# Patient Record
Sex: Male | Born: 1955 | Race: White | Hispanic: No | Marital: Single | Smoking: Former smoker
Health system: Southern US, Community
[De-identification: ages and names within clinical notes are randomized; demographics above are authoritative.]

## PROBLEM LIST (undated history)

## (undated) DIAGNOSIS — F17200 Nicotine dependence, unspecified, uncomplicated: Secondary | ICD-10-CM

## (undated) DIAGNOSIS — J189 Pneumonia, unspecified organism: Secondary | ICD-10-CM

## (undated) DIAGNOSIS — I10 Essential (primary) hypertension: Secondary | ICD-10-CM

## (undated) DIAGNOSIS — B059 Measles without complication: Secondary | ICD-10-CM

## (undated) DIAGNOSIS — G609 Hereditary and idiopathic neuropathy, unspecified: Secondary | ICD-10-CM

## (undated) DIAGNOSIS — J449 Chronic obstructive pulmonary disease, unspecified: Secondary | ICD-10-CM

## (undated) DIAGNOSIS — F200 Paranoid schizophrenia: Secondary | ICD-10-CM

## (undated) DIAGNOSIS — B019 Varicella without complication: Secondary | ICD-10-CM

## (undated) DIAGNOSIS — J939 Pneumothorax, unspecified: Secondary | ICD-10-CM

## (undated) DIAGNOSIS — B269 Mumps without complication: Secondary | ICD-10-CM

## (undated) DIAGNOSIS — C4359 Malignant melanoma of other part of trunk: Secondary | ICD-10-CM

## (undated) HISTORY — DX: Measles without complication: B05.9

## (undated) HISTORY — DX: Malignant melanoma of other part of trunk: C43.59

## (undated) HISTORY — DX: Nicotine dependence, unspecified, uncomplicated: F17.200

## (undated) HISTORY — DX: Hereditary and idiopathic neuropathy, unspecified: G60.9

## (undated) HISTORY — DX: Varicella without complication: B01.9

## (undated) HISTORY — DX: Mumps without complication: B26.9

## (undated) HISTORY — PX: MOHS SURGERY: SUR867

## (undated) HISTORY — PX: WISDOM TOOTH EXTRACTION: SHX21

## (undated) HISTORY — DX: Essential (primary) hypertension: I10

## (undated) HISTORY — DX: Paranoid schizophrenia: F20.0

## (undated) HISTORY — PX: MULTIPLE TOOTH EXTRACTIONS: SHX2053

---

## 1983-08-06 DIAGNOSIS — F200 Paranoid schizophrenia: Secondary | ICD-10-CM

## 1983-08-06 HISTORY — DX: Paranoid schizophrenia: F20.0

## 1983-08-06 HISTORY — PX: CHOLECYSTECTOMY: SHX55

## 2002-06-30 ENCOUNTER — Emergency Department (HOSPITAL_COMMUNITY): Admission: EM | Admit: 2002-06-30 | Discharge: 2002-06-30 | Payer: Self-pay | Admitting: Emergency Medicine

## 2013-05-12 ENCOUNTER — Encounter (HOSPITAL_COMMUNITY): Payer: Self-pay | Admitting: Emergency Medicine

## 2013-05-12 ENCOUNTER — Emergency Department (INDEPENDENT_AMBULATORY_CARE_PROVIDER_SITE_OTHER)
Admission: EM | Admit: 2013-05-12 | Discharge: 2013-05-12 | Disposition: A | Discharge status: Home / Self Care | Payer: Medicare Other | Source: Home / Self Care | Attending: Emergency Medicine | Admitting: Emergency Medicine

## 2013-05-12 DIAGNOSIS — Z23 Encounter for immunization: Secondary | ICD-10-CM

## 2013-05-12 DIAGNOSIS — S61409A Unspecified open wound of unspecified hand, initial encounter: Secondary | ICD-10-CM

## 2013-05-12 DIAGNOSIS — S61432A Puncture wound without foreign body of left hand, initial encounter: Secondary | ICD-10-CM

## 2013-05-12 HISTORY — DX: Essential (primary) hypertension: I10

## 2013-05-12 MED ORDER — TETANUS-DIPHTH-ACELL PERTUSSIS 5-2.5-18.5 LF-MCG/0.5 IM SUSP
INTRAMUSCULAR | Status: AC
  Filled 2013-05-12: qty 0.5

## 2013-05-12 MED ORDER — TETANUS-DIPHTH-ACELL PERTUSSIS 5-2.5-18.5 LF-MCG/0.5 IM SUSP
0.5000 mL | Freq: Once | INTRAMUSCULAR | Status: AC
  Administered 2013-05-12: 0.5 mL via INTRAMUSCULAR

## 2013-05-12 NOTE — ED Notes (Signed)
Ran L hand over rusty barbed wire @ 1600.  Multiple superficial small cuts to palm of L hand.  Bleeding stopped. Pt. States he washed it with soap and water.  No tetanus shot since 2003.  Here with mother. She states he lives in [facility] assisted living.

## 2013-05-12 NOTE — ED Provider Notes (Signed)
Chief Complaint:   Chief Complaint  Patient presents with  . Laceration    History of Present Illness:   Jeffrey Ochoa is a 57 year old male who sustained some puncture wounds on his left hand this afternoon from a barb wire fence. The bleeding is controlled. The barb wire crusty and older. He denies any numbness or tingling. He is able to move all his digits well.  Last tetanus shot was 11 years ago.  Review of Systems:  Other than noted above, the patient denies any of the following symptoms: Systemic:  No fever or chills. Musculoskeletal:  No joint pain or decreased range of motion. Neuro:  No numbness, tingling, or weakness.  PMFSH:  Past medical history, family history, social history, meds, and allergies were reviewed.  He has schizophrenia and takes Prolixin, Cogentin, Tenormin, and Zoloft.  Physical Exam:   Vital signs:  BP 117/73  Pulse 83  Temp(Src) 98.8 F (37.1 C) (Oral)  Resp 24  SpO2 97% Ext:  He has 3 shallow puncture wounds on the palm of his left hand. Bleeding was controlled. He has full range of motion of all his digits and good sensation and capillary refill.  All joints had a full ROM without pain.  Pulses were full.  Good capillary refill in all digits.  No edema. Neurological:  Alert and oriented.  No muscle weakness.  Sensation was intact to light touch.   Course in Urgent Care Center:   Given a Tdap. Wounds were cleansed with Betadine and saline and antibiotic ointment was applied and followed by Telfa and a Coban wrap.  Assessment:  The encounter diagnosis was Puncture wound, hand, left, initial encounter.  Plan:   1.  Meds:  The following meds were prescribed:   Discharge Medication List as of 05/12/2013  6:33 PM      2.  Patient Education/Counseling:  The patient was given appropriate handouts, self care instructions, and instructed in symptomatic relief.  Instructed in wound care.  3.  Follow up:  The patient was told to follow up if no better in  3 to 4 days, if becoming worse in any way, and given some red flag symptoms such as any sign of infection which would prompt immediate return.  Follow up here if necessary.       Reuben Likes, MD 05/12/13 2216

## 2013-08-11 ENCOUNTER — Ambulatory Visit: Payer: Self-pay | Admitting: Family Medicine

## 2013-09-21 ENCOUNTER — Ambulatory Visit: Payer: Medicare Other | Admitting: Family Medicine

## 2013-10-07 ENCOUNTER — Ambulatory Visit: Payer: Medicare Other | Admitting: Family Medicine

## 2013-11-17 ENCOUNTER — Ambulatory Visit: Payer: Medicare Other | Admitting: Internal Medicine

## 2013-11-30 ENCOUNTER — Ambulatory Visit (INDEPENDENT_AMBULATORY_CARE_PROVIDER_SITE_OTHER): Payer: Medicare Other | Admitting: Family Medicine

## 2013-11-30 ENCOUNTER — Encounter: Payer: Self-pay | Admitting: Family Medicine

## 2013-11-30 ENCOUNTER — Telehealth: Payer: Self-pay | Admitting: Family Medicine

## 2013-11-30 VITALS — BP 112/68 | HR 66 | Temp 98.5°F | Ht 79.0 in | Wt 191.1 lb

## 2013-11-30 DIAGNOSIS — C4359 Malignant melanoma of other part of trunk: Secondary | ICD-10-CM | POA: Insufficient documentation

## 2013-11-30 DIAGNOSIS — F200 Paranoid schizophrenia: Secondary | ICD-10-CM

## 2013-11-30 DIAGNOSIS — R52 Pain, unspecified: Secondary | ICD-10-CM

## 2013-11-30 DIAGNOSIS — F17200 Nicotine dependence, unspecified, uncomplicated: Secondary | ICD-10-CM

## 2013-11-30 DIAGNOSIS — I10 Essential (primary) hypertension: Secondary | ICD-10-CM

## 2013-11-30 DIAGNOSIS — G609 Hereditary and idiopathic neuropathy, unspecified: Secondary | ICD-10-CM

## 2013-11-30 MED ORDER — ACETAMINOPHEN 500 MG PO TABS: 500.0000 mg | ORAL_TABLET | Freq: Four times a day (QID) | ORAL | Status: DC | PRN

## 2013-11-30 MED ORDER — CYCLOBENZAPRINE HCL 5 MG PO TABS: 5.0000 mg | ORAL_TABLET | Freq: Three times a day (TID) | ORAL | Status: DC | PRN

## 2013-11-30 NOTE — Patient Instructions (Signed)
Preventive Care for Adults, Male A healthy lifestyle and preventive care can promote health and wellness. Preventive health guidelines for men include the following key practices:  A routine yearly physical is a good way to check with your health care provider about your health and preventative screening. It is a chance to share any concerns and updates on your health and to receive a thorough exam.  Visit your dentist for a routine exam and preventative care every 6 months. Brush your teeth twice a day and floss once a day. Good oral hygiene prevents tooth decay and gum disease.  The frequency of eye exams is based on your age, health, family medical history, use of contact lenses, and other factors. Follow your health care provider's recommendations for frequency of eye exams.  Eat a healthy diet. Foods such as vegetables, fruits, whole grains, low-fat dairy products, and lean protein foods contain the nutrients you need without too many calories. Decrease your intake of foods high in solid fats, added sugars, and salt. Eat the right amount of calories for you.Get information about a proper diet from your health care provider, if necessary.  Regular physical exercise is one of the most important things you can do for your health. Most adults should get at least 150 minutes of moderate-intensity exercise (any activity that increases your heart rate and causes you to sweat) each week. In addition, most adults need muscle-strengthening exercises on 2 or more days a week.  Maintain a healthy weight. The body mass index (BMI) is a screening tool to identify possible weight problems. It provides an estimate of body fat based on height and weight. Your health care provider can find your BMI and can help you achieve or maintain a healthy weight.For adults 20 years and older:  A BMI below 18.5 is considered underweight.  A BMI of 18.5 to 24.9 is normal.  A BMI of 25 to 29.9 is considered  overweight.  A BMI of 30 and above is considered obese.  Maintain normal blood lipids and cholesterol levels by exercising and minimizing your intake of saturated fat. Eat a balanced diet with plenty of fruit and vegetables. Blood tests for lipids and cholesterol should begin at age 42 and be repeated every 5 years. If your lipid or cholesterol levels are high, you are over 50, or you are at high risk for heart disease, you may need your cholesterol levels checked more frequently.Ongoing high lipid and cholesterol levels should be treated with medicines if diet and exercise are not working.  If you smoke, find out from your health care provider how to quit. If you do not use tobacco, do not start.  Lung cancer screening is recommended for adults aged 24 80 years who are at high risk for developing lung cancer because of a history of smoking. A yearly low-dose CT scan of the lungs is recommended for people who have at least a 30-pack-year history of smoking and are a current smoker or have quit within the past 15 years. A pack year of smoking is smoking an average of 1 pack of cigarettes a day for 1 year (for example: 1 pack a day for 30 years or 2 packs a day for 15 years). Yearly screening should continue until the smoker has stopped smoking for at least 15 years. Yearly screening should be stopped for people who develop a health problem that would prevent them from having lung cancer treatment.  If you choose to drink alcohol, do not have  more than 2 drinks per day. One drink is considered to be 12 ounces (355 mL) of beer, 5 ounces (148 mL) of wine, or 1.5 ounces (44 mL) of liquor.  Avoid use of street drugs. Do not share needles with anyone. Ask for help if you need support or instructions about stopping the use of drugs.  High blood pressure causes heart disease and increases the risk of stroke. Your blood pressure should be checked at least every 1 2 years. Ongoing high blood pressure should be  treated with medicines, if weight loss and exercise are not effective.  If you are 75 59 years old, ask your health care provider if you should take aspirin to prevent heart disease.  Diabetes screening involves taking a blood sample to check your fasting blood sugar level. This should be done once every 3 years, after age 19, if you are within normal weight and without risk factors for diabetes. Testing should be considered at a younger age or be carried out more frequently if you are overweight and have at least 1 risk factor for diabetes.  Colorectal cancer can be detected and often prevented. Most routine colorectal cancer screening begins at the age of 47 and continues through age 80. However, your health care provider may recommend screening at an earlier age if you have risk factors for colon cancer. On a yearly basis, your health care provider may provide home test kits to check for hidden blood in the stool. Use of a small camera at the end of a tube to directly examine the colon (sigmoidoscopy or colonoscopy) can detect the earliest forms of colorectal cancer. Talk to your health care provider about this at age 66, when routine screening begins. Direct exam of the colon should be repeated every 5 10 years through age 19, unless early forms of precancerous polyps or small growths are found.  People who are at an increased risk for hepatitis B should be screened for this virus. You are considered at high risk for hepatitis B if:  You were born in a country where hepatitis B occurs often. Talk with your health care provider about which countries are considered high-risk.  Your parents were born in a high-risk country and you have not received a shot to protect against hepatitis B (hepatitis B vaccine).  You have HIV or AIDS.  You use needles to inject street drugs.  You live with, or have sex with, someone who has hepatitis B.  You are a man who has sex with other men (MSM).  You get  hemodialysis treatment.  You take certain medicines for conditions such as cancer, organ transplantation, and autoimmune conditions.  Hepatitis C blood testing is recommended for all people born from [birth year] through 1965 and any individual with known risks for hepatitis C.  Practice safe sex. Use condoms and avoid high-risk sexual practices to reduce the spread of sexually transmitted infections (STIs). STIs include gonorrhea, chlamydia, syphilis, trichomonas, herpes, HPV, and human immunodeficiency virus (HIV). Herpes, HIV, and HPV are viral illnesses that have no cure. They can result in disability, cancer, and death.  A one-time screening for abdominal aortic aneurysm (AAA) and surgical repair of large AAAs by ultrasound are recommended for men ages 94 to 74 years who are current or former smokers.  Healthy men should no longer receive prostate-specific antigen (PSA) blood tests as part of routine cancer screening. Talk with your health care provider about prostate cancer screening.  Testicular cancer screening is not recommended  for adult males who have no symptoms. Screening includes self-exam, a health care provider exam, and other screening tests. Consult with your health care provider about any symptoms you have or any concerns you have about testicular cancer.  Use sunscreen. Apply sunscreen liberally and repeatedly throughout the day. You should seek shade when your shadow is shorter than you. Protect yourself by wearing long sleeves, pants, a wide-brimmed hat, and sunglasses year round, whenever you are outdoors.  Once a month, do a whole-body skin exam, using a mirror to look at the skin on your back. Tell your health care provider about new moles, moles that have irregular borders, moles that are larger than a pencil eraser, or moles that have changed in shape or color.  Stay current with required vaccines (immunizations).  Influenza vaccine. All adults should be immunized every  year.  Tetanus, diphtheria, and acellular pertussis (Td, Tdap) vaccine. An adult who has not previously received Tdap or who does not know his vaccine status should receive 1 dose of Tdap. This initial dose should be followed by tetanus and diphtheria toxoids (Td) booster doses every 10 years. Adults with an unknown or incomplete history of completing a 3-dose immunization series with Td-containing vaccines should begin or complete a primary immunization series including a Tdap dose. Adults should receive a Td booster every 10 years.  Varicella vaccine. An adult without evidence of immunity to varicella should receive 2 doses or a second dose if he has previously received 1 dose.  Human papillomavirus (HPV) vaccine. Males aged 44 21 years who have not received the vaccine previously should receive the 3-dose series. Males aged 43 26 years may be immunized. Immunization is recommended through the age of 50 years for any male who has sex with males and did not get any or all doses earlier. Immunization is recommended for any person with an immunocompromised condition through the age of 23 years if he did not get any or all doses earlier. During the 3-dose series, the second dose should be obtained 4 8 weeks after the first dose. The third dose should be obtained 24 weeks after the first dose and 16 weeks after the second dose.  Zoster vaccine. One dose is recommended for adults aged 96 years or older unless certain conditions are present.  Measles, mumps, and rubella (MMR) vaccine. Adults born before 55 generally are considered immune to measles and mumps. Adults born in [birth year] or later should have 1 or more doses of MMR vaccine unless there is a contraindication to the vaccine or there is laboratory evidence of immunity to each of the three diseases. A routine second dose of MMR vaccine should be obtained at least 28 days after the first dose for students attending postsecondary schools, health care  workers, or international travelers. People who received inactivated measles vaccine or an unknown type of measles vaccine during 1963 1967 should receive 2 doses of MMR vaccine. People who received inactivated mumps vaccine or an unknown type of mumps vaccine before 1979 and are at high risk for mumps infection should consider immunization with 2 doses of MMR vaccine. Unvaccinated health care workers born before [birth year] who lack laboratory evidence of measles, mumps, or rubella immunity or laboratory confirmation of disease should consider measles and mumps immunization with 2 doses of MMR vaccine or rubella immunization with 1 dose of MMR vaccine.  Pneumococcal 13-valent conjugate (PCV13) vaccine. When indicated, a person who is uncertain of his immunization history and has no record of immunization  should receive the PCV13 vaccine. An adult aged 67 years or older who has certain medical conditions and has not been previously immunized should receive 1 dose of PCV13 vaccine. This PCV13 should be followed with a dose of pneumococcal polysaccharide (PPSV23) vaccine. The PPSV23 vaccine dose should be obtained at least 8 weeks after the dose of PCV13 vaccine. An adult aged 79 years or older who has certain medical conditions and previously received 1 or more doses of PPSV23 vaccine should receive 1 dose of PCV13. The PCV13 vaccine dose should be obtained 1 or more years after the last PPSV23 vaccine dose.  Pneumococcal polysaccharide (PPSV23) vaccine. When PCV13 is also indicated, PCV13 should be obtained first. All adults aged 74 years and older should be immunized. An adult younger than age 50 years who has certain medical conditions should be immunized. Any person who resides in a nursing home or long-term care facility should be immunized. An adult smoker should be immunized. People with an immunocompromised condition and certain other conditions should receive both PCV13 and PPSV23 vaccines. People with human  immunodeficiency virus (HIV) infection should be immunized as soon as possible after diagnosis. Immunization during chemotherapy or radiation therapy should be avoided. Routine use of PPSV23 vaccine is not recommended for American Indians, [place] Natives, or people younger than 65 years unless there are medical conditions that require PPSV23 vaccine. When indicated, people who have unknown immunization and have no record of immunization should receive PPSV23 vaccine. One-time revaccination 5 years after the first dose of PPSV23 is recommended for people aged 41 64 years who have chronic kidney failure, nephrotic syndrome, asplenia, or immunocompromised conditions. People who received 1 2 doses of PPSV23 before age 15 years should receive another dose of PPSV23 vaccine at age 48 years or later if at least 5 years have passed since the previous dose. Doses of PPSV23 are not needed for people immunized with PPSV23 at or after age 69 years.  Meningococcal vaccine. Adults with asplenia or persistent complement component deficiencies should receive 2 doses of quadrivalent meningococcal conjugate (MenACWY-D) vaccine. The doses should be obtained at least 2 months apart. Microbiologists working with certain meningococcal bacteria, [place] recruits, people at risk during an outbreak, and people who travel to or live in countries with a high rate of meningitis should be immunized. A first-year college student up through age 7 years who is living in a residence hall should receive a dose if he did not receive a dose on or after his 16th birthday. Adults who have certain high-risk conditions should receive one or more doses of vaccine.  Hepatitis A vaccine. Adults who wish to be protected from this disease, have certain high-risk conditions, work with hepatitis A-infected animals, work in hepatitis A research labs, or travel to or work in countries with a high rate of hepatitis A should be immunized. Adults who were  previously unvaccinated and who anticipate close contact with an international adoptee during the first 60 days after arrival in the [place] from a country with a high rate of hepatitis A should be immunized.  Hepatitis B vaccine. Adults who wish to be protected from this disease, have certain high-risk conditions, may be exposed to blood or other infectious body fluids, are household contacts or sex partners of hepatitis B positive people, are clients or workers in certain care facilities, or travel to or work in countries with a high rate of hepatitis B should be immunized.  Haemophilus influenzae type b (Hib) vaccine. A  previously unvaccinated person with asplenia or sickle cell disease or having a scheduled splenectomy should receive 1 dose of Hib vaccine. Regardless of previous immunization, a recipient of a hematopoietic stem cell transplant should receive a 3-dose series 6 12 months after his successful transplant. Hib vaccine is not recommended for adults with HIV infection. Preventive Service / Frequency Ages 62 to 3  Blood pressure check.** / Every 1 to 2 years.  Lipid and cholesterol check.** / Every 5 years beginning at age 43.  Hepatitis C blood test.** / For any individual with known risks for hepatitis C.  Skin self-exam. / Monthly.  Influenza vaccine. / Every year.  Tetanus, diphtheria, and acellular pertussis (Tdap, Td) vaccine.** / Consult your health care provider. 1 dose of Td every 10 years.  Varicella vaccine.** / Consult your health care provider.  HPV vaccine. / 3 doses over 6 months, if 48 or younger.  Measles, mumps, rubella (MMR) vaccine.** / You need at least 1 dose of MMR if you were born in 1957 or later. You may also need a second dose.  Pneumococcal 13-valent conjugate (PCV13) vaccine.** / Consult your health care provider.  Pneumococcal polysaccharide (PPSV23) vaccine.** / 1 to 2 doses if you smoke cigarettes or if you have certain  conditions.  Meningococcal vaccine.** / 1 dose if you are age 8 to 70 years and a Market researcher living in a residence hall, or have one of several medical conditions. You may also need additional booster doses.  Hepatitis A vaccine.** / Consult your health care provider.  Hepatitis B vaccine.** / Consult your health care provider.  Haemophilus influenzae type b (Hib) vaccine.** / Consult your health care provider. Ages 48 to 32  Blood pressure check.** / Every 1 to 2 years.  Lipid and cholesterol check.** / Every 5 years beginning at age 38.  Lung cancer screening. / Every year if you are aged 40 80 years and have a 30-pack-year history of smoking and currently smoke or have quit within the past 15 years. Yearly screening is stopped once you have quit smoking for at least 15 years or develop a health problem that would prevent you from having lung cancer treatment.  Fecal occult blood test (FOBT) of stool. / Every year beginning at age 4 and continuing until age 70. You may not have to do this test if you get a colonoscopy every 10 years.  Flexible sigmoidoscopy** or colonoscopy.** / Every 5 years for a flexible sigmoidoscopy or every 10 years for a colonoscopy beginning at age 76 and continuing until age 62.  Hepatitis C blood test.** / For all people born from [birth year] through 1965 and any individual with known risks for hepatitis C.  Skin self-exam. / Monthly.  Influenza vaccine. / Every year.  Tetanus, diphtheria, and acellular pertussis (Tdap/Td) vaccine.** / Consult your health care provider. 1 dose of Td every 10 years.  Varicella vaccine.** / Consult your health care provider.  Zoster vaccine.** / 1 dose for adults aged 60 years or older.  Measles, mumps, rubella (MMR) vaccine.** / You need at least 1 dose of MMR if you were born in 1957 or later. You may also need a second dose.  Pneumococcal 13-valent conjugate (PCV13) vaccine.** / Consult your health care  provider.  Pneumococcal polysaccharide (PPSV23) vaccine.** / 1 to 2 doses if you smoke cigarettes or if you have certain conditions.  Meningococcal vaccine.** / Consult your health care provider.  Hepatitis A vaccine.** / Consult your health care  provider.  Hepatitis B vaccine.** / Consult your health care provider.  Haemophilus influenzae type b (Hib) vaccine.** / Consult your health care provider. Ages 65 and over  Blood pressure check.** / Every 1 to 2 years.  Lipid and cholesterol check.**/ Every 5 years beginning at age 20.  Lung cancer screening. / Every year if you are aged 55 80 years and have a 30-pack-year history of smoking and currently smoke or have quit within the past 15 years. Yearly screening is stopped once you have quit smoking for at least 15 years or develop a health problem that would prevent you from having lung cancer treatment.  Fecal occult blood test (FOBT) of stool. / Every year beginning at age 50 and continuing until age 75. You may not have to do this test if you get a colonoscopy every 10 years.  Flexible sigmoidoscopy** or colonoscopy.** / Every 5 years for a flexible sigmoidoscopy or every 10 years for a colonoscopy beginning at age 50 and continuing until age 75.  Hepatitis C blood test.** / For all people born from 1945 through 1965 and any individual with known risks for hepatitis C.  Abdominal aortic aneurysm (AAA) screening.** / A one-time screening for ages 65 to 75 years who are current or former smokers.  Skin self-exam. / Monthly.  Influenza vaccine. / Every year.  Tetanus, diphtheria, and acellular pertussis (Tdap/Td) vaccine.** / 1 dose of Td every 10 years.  Varicella vaccine.** / Consult your health care provider.  Zoster vaccine.** / 1 dose for adults aged 60 years or older.  Pneumococcal 13-valent conjugate (PCV13) vaccine.** / Consult your health care provider.  Pneumococcal polysaccharide (PPSV23) vaccine.** / 1 dose for all  adults aged 65 years and older.  Meningococcal vaccine.** / Consult your health care provider.  Hepatitis A vaccine.** / Consult your health care provider.  Hepatitis B vaccine.** / Consult your health care provider.  Haemophilus influenzae type b (Hib) vaccine.** / Consult your health care provider. **Family history and personal history of risk and conditions may change your health care provider's recommendations. Document Released: 09/17/2001 Document Revised: 05/12/2013 Document Reviewed: 12/17/2010 [employer] Patient Information 2014 [employer].  

## 2013-11-30 NOTE — Progress Notes (Signed)
Pre visit review using our clinic review tool, if applicable. No additional management support is needed unless otherwise documented below in the visit note. 

## 2013-11-30 NOTE — Telephone Encounter (Signed)
Relevant patient education mailed to patient.  

## 2013-12-05 ENCOUNTER — Encounter: Payer: Self-pay | Admitting: Family Medicine

## 2013-12-05 DIAGNOSIS — I10 Essential (primary) hypertension: Secondary | ICD-10-CM

## 2013-12-05 DIAGNOSIS — G609 Hereditary and idiopathic neuropathy, unspecified: Secondary | ICD-10-CM

## 2013-12-05 DIAGNOSIS — F17200 Nicotine dependence, unspecified, uncomplicated: Secondary | ICD-10-CM | POA: Insufficient documentation

## 2013-12-05 HISTORY — DX: Nicotine dependence, unspecified, uncomplicated: F17.200

## 2013-12-05 HISTORY — DX: Hereditary and idiopathic neuropathy, unspecified: G60.9

## 2013-12-05 HISTORY — DX: Essential (primary) hypertension: I10

## 2013-12-05 NOTE — Assessment & Plan Note (Signed)
Following with psychiatry.

## 2013-12-05 NOTE — Assessment & Plan Note (Signed)
Has no interest in quitting, did encourage cessation. Encouraged complete cessation. Discussed need to quit as relates to risk of numerous cancers, cardiac and pulmonary disease as well as neurologic complications. Counseled for greater than 3 minutes

## 2013-12-05 NOTE — Assessment & Plan Note (Signed)
Stable, long standing

## 2013-12-05 NOTE — Progress Notes (Signed)
Patient ID: Jeffrey Ochoa, male   DOB: 1956/06/08, 58 y.o.   MRN: [id number] Jeffrey Ochoa [id number] 1955-10-30 12/05/2013      Progress Note New Patient  Subjective  Chief Complaint  Chief Complaint  Patient presents with  . Establish Care    new patient    HPI  Patient is a 58 year old male in today for routine medical care. Denies CP/palp/SOB/HA/congestion/fevers/GI or GU c/o. Taking meds as prescribed. He is a paranoid schizophrenic who has just relocated to the area to be near his sister and elderly mother. He is in a facility. No acute concerns. His schizophrenia is well controlled on current meds.  Past Medical History  Diagnosis Date  . Hypertension   . Chicken pox as a child  . Measles as a child  . Mumps as a child  . Schizophrenia, paranoid 1985  . Melanoma of back   . Tobacco use disorder 12/05/2013    2 ppd  . Unspecified hereditary and idiopathic peripheral neuropathy 12/05/2013  . HTN (hypertension) 12/05/2013    Past Surgical History  Procedure Laterality Date  . Cholecystectomy  1985  . Wisdom tooth extraction    . Multiple tooth extractions      has all teeth removed  . Mohs surgery      back    Family History  Problem Relation Age of Onset  . Hypertension Mother   . Prostate cancer Father   . Aortic dissection Father   . Fibromyalgia Sister   . Hypertension Sister   . Hyperlipidemia Sister     History   Social History  . Marital Status: Single    Spouse Name: N/A    Number of Children: N/A  . Years of Education: N/A   Occupational History  . Not on file.   Social History Main Topics  . Smoking status: Current Every Day Smoker -- 2.00 packs/day    Types: Cigarettes  . Smokeless tobacco: Former Systems developer  . Alcohol Use: No  . Drug Use: Yes    Special: Marijuana     Comment: none for 6 mos.  . Sexual Activity: No     Comment: lives in group home   Other Topics Concern  . Not on file   Social History Narrative  . No narrative on  file    Current Outpatient Prescriptions on File Prior to Visit  Medication Sig Dispense Refill  . atenolol (TENORMIN) 25 MG tablet Take 25 mg by mouth 2 (two) times daily.      . fluPHENAZine decanoate (PROLIXIN) 25 MG/ML injection Inject 75 mg into the muscle every 14 (fourteen) days.       No current facility-administered medications on file prior to visit.    Allergies  Allergen Reactions  . Risperidone And Related Other (See Comments)    Makes him Manic     Review of Systems  Review of Systems  Constitutional: Negative for fever, chills and malaise/fatigue.  HENT: Negative for congestion, hearing loss and nosebleeds.   Eyes: Negative for discharge.  Respiratory: Negative for cough, sputum production, shortness of breath and wheezing.   Cardiovascular: Negative for chest pain, palpitations and leg swelling.  Gastrointestinal: Negative for heartburn, nausea, vomiting, abdominal pain, diarrhea, constipation and blood in stool.  Genitourinary: Negative for dysuria, urgency, frequency and hematuria.  Musculoskeletal: Negative for back pain, falls and myalgias.  Skin: Negative for rash.  Neurological: Negative for dizziness, tremors, sensory change, focal weakness, loss of consciousness, weakness and headaches.  Endo/Heme/Allergies: Negative for polydipsia. Does not bruise/bleed easily.  Psychiatric/Behavioral: Negative for depression and suicidal ideas. The patient is nervous/anxious. The patient does not have insomnia.     Objective  BP 112/68  Pulse 66  Temp(Src) 98.5 F (36.9 C) (Oral)  Ht 6\' 7"  (2.007 m)  Wt 191 lb 1.9 oz (86.691 kg)  BMI 21.52 kg/m2  SpO2 96%  Physical Exam  Physical Exam  Constitutional: He is oriented to person, place, and time and well-developed, well-nourished, and in no distress. No distress.  HENT:  Head: Normocephalic and atraumatic.  Eyes: Conjunctivae are normal.  Neck: Neck supple. No thyromegaly present.  Cardiovascular: Normal  rate, regular rhythm and normal heart sounds.   No murmur heard. Pulmonary/Chest: Effort normal and breath sounds normal. No respiratory distress.  Abdominal: He exhibits no distension and no mass. There is no tenderness.  Musculoskeletal: He exhibits no edema.  Neurological: He is alert and oriented to person, place, and time.  Skin: Skin is warm.  Psychiatric: Memory, affect and judgment normal.       Assessment & Plan  Schizophrenia, paranoid Following with psychiatry.   Tobacco use disorder Has no interest in quitting, did encourage cessation. Encouraged complete cessation. Discussed need to quit as relates to risk of numerous cancers, cardiac and pulmonary disease as well as neurologic complications. Counseled for greater than 3 minutes  Unspecified hereditary and idiopathic peripheral neuropathy Stable, long standing  HTN (hypertension) Well controlled, no changes to meds. Encouraged heart healthy diet such as the DASH diet and exercise as tolerated. Continue Atenolol  Melanoma of back Follows with dermatology

## 2013-12-05 NOTE — Assessment & Plan Note (Signed)
Follows with dermatology 

## 2013-12-05 NOTE — Assessment & Plan Note (Signed)
Well controlled, no changes to meds. Encouraged heart healthy diet such as the DASH diet and exercise as tolerated. Continue Atenolol

## 2014-04-05 ENCOUNTER — Ambulatory Visit (HOSPITAL_BASED_OUTPATIENT_CLINIC_OR_DEPARTMENT_OTHER)
Admission: RE | Admit: 2014-04-05 | Discharge: 2014-04-05 | Disposition: A | Discharge status: Home / Self Care | Payer: Medicare Other | Source: Ambulatory Visit | Attending: Family Medicine | Admitting: Family Medicine

## 2014-04-05 ENCOUNTER — Ambulatory Visit (INDEPENDENT_AMBULATORY_CARE_PROVIDER_SITE_OTHER): Payer: Medicare Other | Admitting: Family Medicine

## 2014-04-05 ENCOUNTER — Encounter: Payer: Self-pay | Admitting: Family Medicine

## 2014-04-05 VITALS — BP 128/82 | HR 69 | Temp 98.4°F | Ht 79.0 in | Wt 189.8 lb

## 2014-04-05 DIAGNOSIS — Z79899 Other long term (current) drug therapy: Secondary | ICD-10-CM

## 2014-04-05 DIAGNOSIS — E785 Hyperlipidemia, unspecified: Secondary | ICD-10-CM

## 2014-04-05 DIAGNOSIS — F200 Paranoid schizophrenia: Secondary | ICD-10-CM

## 2014-04-05 DIAGNOSIS — R918 Other nonspecific abnormal finding of lung field: Secondary | ICD-10-CM

## 2014-04-05 DIAGNOSIS — Z0000 Encounter for general adult medical examination without abnormal findings: Secondary | ICD-10-CM

## 2014-04-05 DIAGNOSIS — R222 Localized swelling, mass and lump, trunk: Secondary | ICD-10-CM | POA: Insufficient documentation

## 2014-04-05 DIAGNOSIS — E559 Vitamin D deficiency, unspecified: Secondary | ICD-10-CM

## 2014-04-05 DIAGNOSIS — C4359 Malignant melanoma of other part of trunk: Secondary | ICD-10-CM

## 2014-04-05 DIAGNOSIS — I10 Essential (primary) hypertension: Secondary | ICD-10-CM

## 2014-04-05 DIAGNOSIS — F17200 Nicotine dependence, unspecified, uncomplicated: Secondary | ICD-10-CM

## 2014-04-05 NOTE — Patient Instructions (Signed)
Preventive Care for Adults A healthy lifestyle and preventive care can promote health and wellness. Preventive health guidelines for men include the following key practices:  A routine yearly physical is a good way to check with your health care provider about your health and preventative screening. It is a chance to share any concerns and updates on your health and to receive a thorough exam.  Visit your dentist for a routine exam and preventative care every 6 months. Brush your teeth twice a day and floss once a day. Good oral hygiene prevents tooth decay and gum disease.  The frequency of eye exams is based on your age, health, family medical history, use of contact lenses, and other factors. Follow your health care provider's recommendations for frequency of eye exams.  Eat a healthy diet. Foods such as vegetables, fruits, whole grains, low-fat dairy products, and lean protein foods contain the nutrients you need without too many calories. Decrease your intake of foods high in solid fats, added sugars, and salt. Eat the right amount of calories for you.Get information about a proper diet from your health care provider, if necessary.  Regular physical exercise is one of the most important things you can do for your health. Most adults should get at least 150 minutes of moderate-intensity exercise (any activity that increases your heart rate and causes you to sweat) each week. In addition, most adults need muscle-strengthening exercises on 2 or more days a week.  Maintain a healthy weight. The body mass index (BMI) is a screening tool to identify possible weight problems. It provides an estimate of body fat based on height and weight. Your health care provider can find your BMI and can help you achieve or maintain a healthy weight.For adults 20 years and older:  A BMI below 18.5 is considered underweight.  A BMI of 18.5 to 24.9 is normal.  A BMI of 25 to 29.9 is considered overweight.  A BMI  of 30 and above is considered obese.  Maintain normal blood lipids and cholesterol levels by exercising and minimizing your intake of saturated fat. Eat a balanced diet with plenty of fruit and vegetables. Blood tests for lipids and cholesterol should begin at age 50 and be repeated every 5 years. If your lipid or cholesterol levels are high, you are over 50, or you are at high risk for heart disease, you may need your cholesterol levels checked more frequently.Ongoing high lipid and cholesterol levels should be treated with medicines if diet and exercise are not working.  If you smoke, find out from your health care provider how to quit. If you do not use tobacco, do not start.  Lung cancer screening is recommended for adults aged 73-80 years who are at high risk for developing lung cancer because of a history of smoking. A yearly low-dose CT scan of the lungs is recommended for people who have at least a 30-pack-year history of smoking and are a current smoker or have quit within the past 15 years. A pack year of smoking is smoking an average of 1 pack of cigarettes a day for 1 year (for example: 1 pack a day for 30 years or 2 packs a day for 15 years). Yearly screening should continue until the smoker has stopped smoking for at least 15 years. Yearly screening should be stopped for people who develop a health problem that would prevent them from having lung cancer treatment.  If you choose to drink alcohol, do not have more than  2 drinks per day. One drink is considered to be 12 ounces (355 mL) of beer, 5 ounces (148 mL) of wine, or 1.5 ounces (44 mL) of liquor.  Avoid use of street drugs. Do not share needles with anyone. Ask for help if you need support or instructions about stopping the use of drugs.  High blood pressure causes heart disease and increases the risk of stroke. Your blood pressure should be checked at least every 1-2 years. Ongoing high blood pressure should be treated with  medicines, if weight loss and exercise are not effective.  If you are 45-79 years old, ask your health care provider if you should take aspirin to prevent heart disease.  Diabetes screening involves taking a blood sample to check your fasting blood sugar level. This should be done once every 3 years, after age 45, if you are within normal weight and without risk factors for diabetes. Testing should be considered at a younger age or be carried out more frequently if you are overweight and have at least 1 risk factor for diabetes.  Colorectal cancer can be detected and often prevented. Most routine colorectal cancer screening begins at the age of 50 and continues through age 75. However, your health care provider may recommend screening at an earlier age if you have risk factors for colon cancer. On a yearly basis, your health care provider may provide home test kits to check for hidden blood in the stool. Use of a small camera at the end of a tube to directly examine the colon (sigmoidoscopy or colonoscopy) can detect the earliest forms of colorectal cancer. Talk to your health care provider about this at age 50, when routine screening begins. Direct exam of the colon should be repeated every 5-10 years through age 75, unless early forms of precancerous polyps or small growths are found.  People who are at an increased risk for hepatitis B should be screened for this virus. You are considered at high risk for hepatitis B if:  You were born in a country where hepatitis B occurs often. Talk with your health care provider about which countries are considered high risk.  Your parents were born in a high-risk country and you have not received a shot to protect against hepatitis B (hepatitis B vaccine).  You have HIV or AIDS.  You use needles to inject street drugs.  You live with, or have sex with, someone who has hepatitis B.  You are a man who has sex with other men (MSM).  You get hemodialysis  treatment.  You take certain medicines for conditions such as cancer, organ transplantation, and autoimmune conditions.  Hepatitis C blood testing is recommended for all people born from 1945 through 1965 and any individual with known risks for hepatitis C.  Practice safe sex. Use condoms and avoid high-risk sexual practices to reduce the spread of sexually transmitted infections (STIs). STIs include gonorrhea, chlamydia, syphilis, trichomonas, herpes, HPV, and human immunodeficiency virus (HIV). Herpes, HIV, and HPV are viral illnesses that have no cure. They can result in disability, cancer, and death.  If you are at risk of being infected with HIV, it is recommended that you take a prescription medicine daily to prevent HIV infection. This is called preexposure prophylaxis (PrEP). You are considered at risk if:  You are a man who has sex with other men (MSM) and have other risk factors.  You are a heterosexual man, are sexually active, and are at increased risk for HIV infection.    You take drugs by injection.  You are sexually active with a partner who has HIV.  Talk with your health care provider about whether you are at high risk of being infected with HIV. If you choose to begin PrEP, you should first be tested for HIV. You should then be tested every 3 months for as long as you are taking PrEP.  A one-time screening for abdominal aortic aneurysm (AAA) and surgical repair of large AAAs by ultrasound are recommended for men ages 32 to 67 years who are current or former smokers.  Healthy men should no longer receive prostate-specific antigen (PSA) blood tests as part of routine cancer screening. Talk with your health care provider about prostate cancer screening.  Testicular cancer screening is not recommended for adult males who have no symptoms. Screening includes self-exam, a health care provider exam, and other screening tests. Consult with your health care provider about any symptoms  you have or any concerns you have about testicular cancer.  Use sunscreen. Apply sunscreen liberally and repeatedly throughout the day. You should seek shade when your shadow is shorter than you. Protect yourself by wearing long sleeves, pants, a wide-brimmed hat, and sunglasses year round, whenever you are outdoors.  Once a month, do a whole-body skin exam, using a mirror to look at the skin on your back. Tell your health care provider about new moles, moles that have irregular borders, moles that are larger than a pencil eraser, or moles that have changed in shape or color.  Stay current with required vaccines (immunizations).  Influenza vaccine. All adults should be immunized every year.  Tetanus, diphtheria, and acellular pertussis (Td, Tdap) vaccine. An adult who has not previously received Tdap or who does not know his vaccine status should receive 1 dose of Tdap. This initial dose should be followed by tetanus and diphtheria toxoids (Td) booster doses every 10 years. Adults with an unknown or incomplete history of completing a 3-dose immunization series with Td-containing vaccines should begin or complete a primary immunization series including a Tdap dose. Adults should receive a Td booster every 10 years.  Varicella vaccine. An adult without evidence of immunity to varicella should receive 2 doses or a second dose if he has previously received 1 dose.  Human papillomavirus (HPV) vaccine. Males aged 68-21 years who have not received the vaccine previously should receive the 3-dose series. Males aged 22-26 years may be immunized. Immunization is recommended through the age of 6 years for any male who has sex with males and did not get any or all doses earlier. Immunization is recommended for any person with an immunocompromised condition through the age of 49 years if he did not get any or all doses earlier. During the 3-dose series, the second dose should be obtained 4-8 weeks after the first  dose. The third dose should be obtained 24 weeks after the first dose and 16 weeks after the second dose.  Zoster vaccine. One dose is recommended for adults aged 50 years or older unless certain conditions are present.  Measles, mumps, and rubella (MMR) vaccine. Adults born before [birth year] generally are considered immune to measles and mumps. Adults born in [birth year] or later should have 1 or more doses of MMR vaccine unless there is a contraindication to the vaccine or there is laboratory evidence of immunity to each of the three diseases. A routine second dose of MMR vaccine should be obtained at least 28 days after the first dose for students attending postsecondary  schools, health care workers, or international travelers. People who received inactivated measles vaccine or an unknown type of measles vaccine during 1963-1967 should receive 2 doses of MMR vaccine. People who received inactivated mumps vaccine or an unknown type of mumps vaccine before 1979 and are at high risk for mumps infection should consider immunization with 2 doses of MMR vaccine. Unvaccinated health care workers born before 1957 who lack laboratory evidence of measles, mumps, or rubella immunity or laboratory confirmation of disease should consider measles and mumps immunization with 2 doses of MMR vaccine or rubella immunization with 1 dose of MMR vaccine.  Pneumococcal 13-valent conjugate (PCV13) vaccine. When indicated, a person who is uncertain of his immunization history and has no record of immunization should receive the PCV13 vaccine. An adult aged 19 years or older who has certain medical conditions and has not been previously immunized should receive 1 dose of PCV13 vaccine. This PCV13 should be followed with a dose of pneumococcal polysaccharide (PPSV23) vaccine. The PPSV23 vaccine dose should be obtained at least 8 weeks after the dose of PCV13 vaccine. An adult aged 19 years or older who has certain medical conditions and  previously received 1 or more doses of PPSV23 vaccine should receive 1 dose of PCV13. The PCV13 vaccine dose should be obtained 1 or more years after the last PPSV23 vaccine dose.  Pneumococcal polysaccharide (PPSV23) vaccine. When PCV13 is also indicated, PCV13 should be obtained first. All adults aged 65 years and older should be immunized. An adult younger than age 65 years who has certain medical conditions should be immunized. Any person who resides in a nursing home or long-term care facility should be immunized. An adult smoker should be immunized. People with an immunocompromised condition and certain other conditions should receive both PCV13 and PPSV23 vaccines. People with human immunodeficiency virus (HIV) infection should be immunized as soon as possible after diagnosis. Immunization during chemotherapy or radiation therapy should be avoided. Routine use of PPSV23 vaccine is not recommended for American Indians, [place] Natives, or people younger than 65 years unless there are medical conditions that require PPSV23 vaccine. When indicated, people who have unknown immunization and have no record of immunization should receive PPSV23 vaccine. One-time revaccination 5 years after the first dose of PPSV23 is recommended for people aged 19-64 years who have chronic kidney failure, nephrotic syndrome, asplenia, or immunocompromised conditions. People who received 1-2 doses of PPSV23 before age 65 years should receive another dose of PPSV23 vaccine at age 65 years or later if at least 5 years have passed since the previous dose. Doses of PPSV23 are not needed for people immunized with PPSV23 at or after age 65 years.  Meningococcal vaccine. Adults with asplenia or persistent complement component deficiencies should receive 2 doses of quadrivalent meningococcal conjugate (MenACWY-D) vaccine. The doses should be obtained at least 2 months apart. Microbiologists working with certain meningococcal bacteria,  military recruits, people at risk during an outbreak, and people who travel to or live in countries with a high rate of meningitis should be immunized. A first-year college student up through age 21 years who is living in a residence hall should receive a dose if he did not receive a dose on or after his 16th birthday. Adults who have certain high-risk conditions should receive one or more doses of vaccine.  Hepatitis A vaccine. Adults who wish to be protected from this disease, have certain high-risk conditions, work with hepatitis A-infected animals, work in hepatitis A research labs, or   travel to or work in countries with a high rate of hepatitis A should be immunized. Adults who were previously unvaccinated and who anticipate close contact with an international adoptee during the first 60 days after arrival in the [place] from a country with a high rate of hepatitis A should be immunized.  Hepatitis B vaccine. Adults should be immunized if they wish to be protected from this disease, have certain high-risk conditions, may be exposed to blood or other infectious body fluids, are household contacts or sex partners of hepatitis B positive people, are clients or workers in certain care facilities, or travel to or work in countries with a high rate of hepatitis B.  Haemophilus influenzae type b (Hib) vaccine. A previously unvaccinated person with asplenia or sickle cell disease or having a scheduled splenectomy should receive 1 dose of Hib vaccine. Regardless of previous immunization, a recipient of a hematopoietic stem cell transplant should receive a 3-dose series 6-12 months after his successful transplant. Hib vaccine is not recommended for adults with HIV infection. Preventive Service / Frequency Ages 52 to 17  Blood pressure check.** / Every 1 to 2 years.  Lipid and cholesterol check.** / Every 5 years beginning at age 69.  Hepatitis C blood test.** / For any individual with known risks for  hepatitis C.  Skin self-exam. / Monthly.  Influenza vaccine. / Every year.  Tetanus, diphtheria, and acellular pertussis (Tdap, Td) vaccine.** / Consult your health care provider. 1 dose of Td every 10 years.  Varicella vaccine.** / Consult your health care provider.  HPV vaccine. / 3 doses over 6 months, if 72 or younger.  Measles, mumps, rubella (MMR) vaccine.** / You need at least 1 dose of MMR if you were born in 1957 or later. You may also need a second dose.  Pneumococcal 13-valent conjugate (PCV13) vaccine.** / Consult your health care provider.  Pneumococcal polysaccharide (PPSV23) vaccine.** / 1 to 2 doses if you smoke cigarettes or if you have certain conditions.  Meningococcal vaccine.** / 1 dose if you are age 35 to 60 years and a Market researcher living in a residence hall, or have one of several medical conditions. You may also need additional booster doses.  Hepatitis A vaccine.** / Consult your health care provider.  Hepatitis B vaccine.** / Consult your health care provider.  Haemophilus influenzae type b (Hib) vaccine.** / Consult your health care provider. Ages 35 to 8  Blood pressure check.** / Every 1 to 2 years.  Lipid and cholesterol check.** / Every 5 years beginning at age 57.  Lung cancer screening. / Every year if you are aged 44-80 years and have a 30-pack-year history of smoking and currently smoke or have quit within the past 15 years. Yearly screening is stopped once you have quit smoking for at least 15 years or develop a health problem that would prevent you from having lung cancer treatment.  Fecal occult blood test (FOBT) of stool. / Every year beginning at age 55 and continuing until age 73. You may not have to do this test if you get a colonoscopy every 10 years.  Flexible sigmoidoscopy** or colonoscopy.** / Every 5 years for a flexible sigmoidoscopy or every 10 years for a colonoscopy beginning at age 28 and continuing until age  1.  Hepatitis C blood test.** / For all people born from [birth year] through 1965 and any individual with known risks for hepatitis C.  Skin self-exam. / Monthly.  Influenza vaccine. / Every  year.  Tetanus, diphtheria, and acellular pertussis (Tdap/Td) vaccine.** / Consult your health care provider. 1 dose of Td every 10 years.  Varicella vaccine.** / Consult your health care provider.  Zoster vaccine.** / 1 dose for adults aged 53 years or older.  Measles, mumps, rubella (MMR) vaccine.** / You need at least 1 dose of MMR if you were born in 1957 or later. You may also need a second dose.  Pneumococcal 13-valent conjugate (PCV13) vaccine.** / Consult your health care provider.  Pneumococcal polysaccharide (PPSV23) vaccine.** / 1 to 2 doses if you smoke cigarettes or if you have certain conditions.  Meningococcal vaccine.** / Consult your health care provider.  Hepatitis A vaccine.** / Consult your health care provider.  Hepatitis B vaccine.** / Consult your health care provider.  Haemophilus influenzae type b (Hib) vaccine.** / Consult your health care provider. Ages 77 and over  Blood pressure check.** / Every 1 to 2 years.  Lipid and cholesterol check.**/ Every 5 years beginning at age 85.  Lung cancer screening. / Every year if you are aged 55-80 years and have a 30-pack-year history of smoking and currently smoke or have quit within the past 15 years. Yearly screening is stopped once you have quit smoking for at least 15 years or develop a health problem that would prevent you from having lung cancer treatment.  Fecal occult blood test (FOBT) of stool. / Every year beginning at age 33 and continuing until age 11. You may not have to do this test if you get a colonoscopy every 10 years.  Flexible sigmoidoscopy** or colonoscopy.** / Every 5 years for a flexible sigmoidoscopy or every 10 years for a colonoscopy beginning at age 28 and continuing until age 73.  Hepatitis C blood  test.** / For all people born from [birth year] through 1965 and any individual with known risks for hepatitis C.  Abdominal aortic aneurysm (AAA) screening.** / A one-time screening for ages 50 to 27 years who are current or former smokers.  Skin self-exam. / Monthly.  Influenza vaccine. / Every year.  Tetanus, diphtheria, and acellular pertussis (Tdap/Td) vaccine.** / 1 dose of Td every 10 years.  Varicella vaccine.** / Consult your health care provider.  Zoster vaccine.** / 1 dose for adults aged 34 years or older.  Pneumococcal 13-valent conjugate (PCV13) vaccine.** / Consult your health care provider.  Pneumococcal polysaccharide (PPSV23) vaccine.** / 1 dose for all adults aged 63 years and older.  Meningococcal vaccine.** / Consult your health care provider.  Hepatitis A vaccine.** / Consult your health care provider.  Hepatitis B vaccine.** / Consult your health care provider.  Haemophilus influenzae type b (Hib) vaccine.** / Consult your health care provider. **Family history and personal history of risk and conditions may change your health care provider's recommendations. Document Released: 09/17/2001 Document Revised: 07/27/2013 Document Reviewed: 12/17/2010 [facility] Patient Information 2015 [place], [place]. This information is not intended to replace advice given to you by your health care provider. Make sure you discuss any questions you have with your health care provider.

## 2014-04-05 NOTE — Progress Notes (Signed)
Pre visit review using our clinic review tool, if applicable. No additional management support is needed unless otherwise documented below in the visit note. 

## 2014-04-06 ENCOUNTER — Telehealth: Payer: Self-pay

## 2014-04-06 LAB — CBC
HEMATOCRIT: 46.5 % (ref 39.0–52.0)
Hemoglobin: 15.4 g/dL (ref 13.0–17.0)
MCHC: 33.1 g/dL (ref 30.0–36.0)
MCV: 96.9 fl (ref 78.0–100.0)
Platelets: 217 10*3/uL (ref 150.0–400.0)
RBC: 4.8 Mil/uL (ref 4.22–5.81)
RDW: 13.6 % (ref 11.5–15.5)
WBC: 9.1 10*3/uL (ref 4.0–10.5)

## 2014-04-06 LAB — HEPATIC FUNCTION PANEL
ALK PHOS: 97 U/L (ref 39–117)
ALT: 15 U/L (ref 0–53)
AST: 16 U/L (ref 0–37)
Albumin: 4.3 g/dL (ref 3.5–5.2)
BILIRUBIN TOTAL: 0.8 mg/dL (ref 0.2–1.2)
Bilirubin, Direct: 0.1 mg/dL (ref 0.0–0.3)
Total Protein: 7.1 g/dL (ref 6.0–8.3)

## 2014-04-06 LAB — RENAL FUNCTION PANEL
Albumin: 4.3 g/dL (ref 3.5–5.2)
BUN: 8 mg/dL (ref 6–23)
CALCIUM: 9.1 mg/dL (ref 8.4–10.5)
CHLORIDE: 106 meq/L (ref 96–112)
CO2: 30 meq/L (ref 19–32)
Creatinine, Ser: 0.8 mg/dL (ref 0.4–1.5)
GFR: 103.59 mL/min (ref >60.00)
Glucose, Bld: 90 mg/dL (ref 70–99)
POTASSIUM: 4.1 meq/L (ref 3.5–5.1)
Phosphorus: 3.9 mg/dL (ref 2.3–4.6)
SODIUM: 143 meq/L (ref 135–145)

## 2014-04-06 LAB — VITAMIN D 25 HYDROXY (VIT D DEFICIENCY, FRACTURES): VITD: 22.32 ng/mL — ABNORMAL LOW (ref 30.00–100.00)

## 2014-04-06 LAB — LIPID PANEL
Cholesterol: 144 mg/dL (ref 0–200)
HDL: 40.7 mg/dL (ref >39.00)
LDL Cholesterol: 86 mg/dL (ref 0–99)
NonHDL: 103.3
Total CHOL/HDL Ratio: 4
Triglycerides: 85 mg/dL (ref 0.0–149.0)
VLDL: 17 mg/dL (ref 0.0–40.0)

## 2014-04-06 LAB — PSA: PSA: 0.78 ng/mL (ref 0.10–4.00)

## 2014-04-06 LAB — TSH: TSH: 1.18 u[IU]/mL (ref 0.35–4.50)

## 2014-04-06 NOTE — Telephone Encounter (Signed)
Message copied by Varney Daily on Wed Apr 06, 2014 10:16 AM ------      Message from: Irven Baltimore      Created: Wed Apr 06, 2014 10:03 AM       Patient sister states that she is returning your call. ------

## 2014-04-06 NOTE — Telephone Encounter (Signed)
Patients sister doesn't have any access to his medical records.?  Pts sister is going to come by today to pickup paperwork to have brother sign stating we can speak to her. She is on her mothers demographics. I looked in Media and can't find where anything was scanned. Pt states they filled out this at his first appt. I apologized to patients sister and she voiced understanding stating she knows it wasn't me its HIPPA.  I did however tell pts sister there was nothing to be worried about and we would give her the final results once we get the paperwork back.   Pt voiced understanding

## 2014-04-11 ENCOUNTER — Encounter: Payer: Self-pay | Admitting: Family Medicine

## 2014-04-11 DIAGNOSIS — E559 Vitamin D deficiency, unspecified: Secondary | ICD-10-CM | POA: Insufficient documentation

## 2014-04-11 DIAGNOSIS — E785 Hyperlipidemia, unspecified: Secondary | ICD-10-CM | POA: Insufficient documentation

## 2014-04-11 DIAGNOSIS — Z0000 Encounter for general adult medical examination without abnormal findings: Secondary | ICD-10-CM | POA: Insufficient documentation

## 2014-04-11 DIAGNOSIS — R918 Other nonspecific abnormal finding of lung field: Secondary | ICD-10-CM | POA: Insufficient documentation

## 2014-04-11 NOTE — Assessment & Plan Note (Signed)
Start Vitamin D supplements

## 2014-04-11 NOTE — Assessment & Plan Note (Signed)
Reviewed old records and xray from 2011 showed lung mass vs pneumonia, repeat CXR today shows clearing.

## 2014-04-11 NOTE — Progress Notes (Signed)
Patient ID: Jeffrey Ochoa, male   DOB: 1955/12/01, 58 y.o.   MRN: [id number] JAEVON PARAS [id number] 1955/12/20 04/11/2014      Progress Note-Follow Up  Subjective  Chief Complaint  Chief Complaint  Patient presents with  . Annual Exam    medicare wellness    HPI  Patient is a 58 year old male in today for routine medical care. Male with history of paranoid schizophrenia in today with his mother. Lives in a domiciliary but she tends to his medical care. He doing well. Declines most medical treatment. No acute illness or concerns. Continues to smoke 2 packs per day. Denies CP/palp/SOB/HA/congestion/fevers/GI or GU c/o. Taking meds as prescribed  Past Medical History  Diagnosis Date  . Hypertension   . Chicken pox as a child  . Measles as a child  . Mumps as a child  . Schizophrenia, paranoid 1985  . Melanoma of back   . Tobacco use disorder 12/05/2013    2 ppd  . Unspecified hereditary and idiopathic peripheral neuropathy 12/05/2013  . HTN (hypertension) 12/05/2013    Past Surgical History  Procedure Laterality Date  . Cholecystectomy  1985  . Wisdom tooth extraction    . Multiple tooth extractions      has all teeth removed  . Mohs surgery      back    Family History  Problem Relation Age of Onset  . Hypertension Mother   . Prostate cancer Father   . Aortic dissection Father   . Fibromyalgia Sister   . Hypertension Sister   . Hyperlipidemia Sister     History   Social History  . Marital Status: Single    Spouse Name: N/A    Number of Children: N/A  . Years of Education: N/A   Occupational History  . Not on file.   Social History Main Topics  . Smoking status: Current Every Day Smoker -- 2.00 packs/day    Types: Cigarettes  . Smokeless tobacco: Former Systems developer  . Alcohol Use: No  . Drug Use: Yes    Special: Marijuana     Comment: none for 6 mos.  . Sexual Activity: No     Comment: lives in group home   Other Topics Concern  . Not on file   Social  History Narrative  . No narrative on file    Current Outpatient Prescriptions on File Prior to Visit  Medication Sig Dispense Refill  . acetaminophen (TYLENOL) 500 MG tablet Take 1 tablet (500 mg total) by mouth every 6 (six) hours as needed for mild pain or moderate pain.  30 tablet  5  . atenolol (TENORMIN) 25 MG tablet Take 25 mg by mouth 2 (two) times daily.      . benztropine (COGENTIN) 2 MG tablet Take 2 mg by mouth daily.      . clonazePAM (KLONOPIN) 1 MG tablet Take 1 mg by mouth 2 (two) times daily.      . cyclobenzaprine (FLEXERIL) 5 MG tablet Take 1 tablet (5 mg total) by mouth 3 (three) times daily as needed for muscle spasms.  30 tablet  5  . fluPHENAZine decanoate (PROLIXIN) 25 MG/ML injection Inject 75 mg into the muscle every 14 (fourteen) days.      Marland Kitchen sertraline (ZOLOFT) 50 MG tablet Take 50 mg by mouth daily.       No current facility-administered medications on file prior to visit.    Allergies  Allergen Reactions  . Risperidone And Related Other (  See Comments)    Makes him Manic     Review of Systems  Review of Systems  Constitutional: Negative for fever, chills and malaise/fatigue.  HENT: Negative for congestion, hearing loss and nosebleeds.   Eyes: Negative for discharge.  Respiratory: Negative for cough, sputum production, shortness of breath and wheezing.   Cardiovascular: Negative for chest pain, palpitations and leg swelling.  Gastrointestinal: Negative for heartburn, nausea, vomiting, abdominal pain, diarrhea, constipation and blood in stool.  Genitourinary: Negative for dysuria, urgency, frequency and hematuria.  Musculoskeletal: Negative for back pain, falls and myalgias.  Skin: Negative for rash.  Neurological: Negative for dizziness, tremors, sensory change, focal weakness, loss of consciousness, weakness and headaches.  Endo/Heme/Allergies: Negative for polydipsia. Does not bruise/bleed easily.  Psychiatric/Behavioral: Negative for depression and  suicidal ideas. The patient is not nervous/anxious and does not have insomnia.     Objective  BP 128/82  Pulse 69  Temp(Src) 98.4 F (36.9 C) (Oral)  Ht 6\' 7"  (2.007 m)  Wt 189 lb 12.8 oz (86.093 kg)  BMI 21.37 kg/m2  SpO2 96%  Physical Exam  Physical Exam  Constitutional: He is oriented to person, place, and time and well-developed, well-nourished, and in no distress. No distress.  HENT:  Head: Normocephalic and atraumatic.  Eyes: Conjunctivae are normal.  Neck: Neck supple. No thyromegaly present.  Cardiovascular: Normal rate, regular rhythm and normal heart sounds.   No murmur heard. Pulmonary/Chest: Effort normal and breath sounds normal. No respiratory distress.  Abdominal: He exhibits no distension and no mass. There is no tenderness.  Musculoskeletal: He exhibits no edema.  Neurological: He is alert and oriented to person, place, and time.  Skin: Skin is warm.  Psychiatric: Memory, affect and judgment normal.    Lab Results  Component Value Date   TSH 1.18 04/05/2014   Lab Results  Component Value Date   WBC 9.1 04/05/2014   HGB 15.4 04/05/2014   HCT 46.5 04/05/2014   MCV 96.9 04/05/2014   PLT 217.0 04/05/2014   Lab Results  Component Value Date   CREATININE 0.8 04/05/2014   BUN 8 04/05/2014   NA 143 04/05/2014   K 4.1 04/05/2014   CL 106 04/05/2014   CO2 30 04/05/2014   Lab Results  Component Value Date   ALT 15 04/05/2014   AST 16 04/05/2014   ALKPHOS 97 04/05/2014   BILITOT 0.8 04/05/2014   Lab Results  Component Value Date   CHOL 144 04/05/2014   Lab Results  Component Value Date   HDL 40.70 04/05/2014   Lab Results  Component Value Date   LDLCALC 86 04/05/2014   Lab Results  Component Value Date   TRIG 85.0 04/05/2014   Lab Results  Component Value Date   CHOLHDL 4 04/05/2014     Assessment & Plan  HTN (hypertension) Well controlled, no changes to meds. Encouraged heart healthy diet such as the DASH diet and exercise as tolerated.   Tobacco use  disorder Encouraged complete cessation. Discussed need to quit as relates to risk of numerous cancers, cardiac and pulmonary disease as well as neurologic complications. Counseled for greater than 3 minutes. Patient declines to try cessation  Melanoma of back Offered referral to dermatology for surveillance declines for now  Schizophrenia, paranoid Here today with mother who helps to manage his care, lives in a facility, doing well.   Other and unspecified hyperlipidemia Encouraged heart healthy diet, increase exercise, avoid trans fats, consider a krill oil cap daily  Unspecified  vitamin D deficiency Start Vitamin D supplements   Medicare annual wellness visit, subsequent Patient denies any difficulties at home. No trouble with ADLs, depression or falls. No recent changes to vision or hearing. Is UTD with immunizations. Is UTD with screening. Discussed Advanced Directives, patient agrees to bring [place] copies of documents if can. Encouraged heart healthy diet, exercise as tolerated and adequate sleep. Does not see any other physicians and declines any referrals. Declines immunizations including flu. Declines colonoscopy  Lung mass Reviewed old records and xray from 2011 showed lung mass vs pneumonia, repeat CXR today shows clearing.

## 2014-04-11 NOTE — Assessment & Plan Note (Signed)
Well controlled, no changes to meds. Encouraged heart healthy diet such as the DASH diet and exercise as tolerated.  °

## 2014-04-11 NOTE — Assessment & Plan Note (Signed)
Here today with mother who helps to manage his care, lives in a facility, doing well.

## 2014-04-11 NOTE — Assessment & Plan Note (Signed)
Encouraged heart healthy diet, increase exercise, avoid trans fats, consider a krill oil cap daily 

## 2014-04-11 NOTE — Assessment & Plan Note (Addendum)
Offered referral to dermatology for surveillance declines for now

## 2014-04-11 NOTE — Assessment & Plan Note (Addendum)
Encouraged complete cessation. Discussed need to quit as relates to risk of numerous cancers, cardiac and pulmonary disease as well as neurologic complications. Counseled for greater than 3 minutes. Patient declines to try cessation

## 2014-04-11 NOTE — Assessment & Plan Note (Signed)
Patient denies any difficulties at home. No trouble with ADLs, depression or falls. No recent changes to vision or hearing. Is UTD with immunizations. Is UTD with screening. Discussed Advanced Directives, patient agrees to bring [place] copies of documents if can. Encouraged heart healthy diet, exercise as tolerated and adequate sleep. Does not see any other physicians and declines any referrals. Declines immunizations including flu. Declines colonoscopy

## 2014-04-12 ENCOUNTER — Telehealth: Payer: Self-pay

## 2014-04-12 NOTE — Telephone Encounter (Signed)
Message copied by Varney Daily on Tue Apr 12, 2014  8:36 AM ------      Message from: Penni Homans A      Created: Mon Apr 11, 2014 12:52 PM                   ----- Message -----         From: Mosie Lukes, MD         Sent: 04/11/2014  10:40 AM           To: Mosie Lukes, MD            He needs Vitamin D supplements did I send that note to you already? ------

## 2014-04-12 NOTE — Telephone Encounter (Signed)
Yes, this was sent to me and a letter was mailed to address in computer. We don't have a direct number for patient and sister is the care giver however we don't have permission to give information to her. pts sister was going to come and get the paperwork to take to her brothers (pt) facility to get him to fill out paperwork to update records

## 2014-06-16 ENCOUNTER — Ambulatory Visit: Payer: Medicare Other | Admitting: Family Medicine

## 2014-06-20 ENCOUNTER — Telehealth: Payer: Self-pay | Admitting: *Deleted

## 2014-06-20 DIAGNOSIS — Z7689 Persons encountering health services in other specified circumstances: Secondary | ICD-10-CM

## 2014-06-20 NOTE — Telephone Encounter (Signed)
FL2 dropped off by Lolita Cram. Form completed. Alinda Dooms at [phone] to pick up. JG//CMA

## 2016-06-05 DIAGNOSIS — J189 Pneumonia, unspecified organism: Secondary | ICD-10-CM

## 2016-06-05 HISTORY — DX: Pneumonia, unspecified organism: J18.9

## 2016-06-16 ENCOUNTER — Inpatient Hospital Stay (HOSPITAL_COMMUNITY)
Admission: EM | Admit: 2016-06-16 | Discharge: 2016-06-24 | DRG: 871 | Disposition: A | Discharge status: Other Acute Inpatient Hospital | Payer: Medicare Other | Attending: Internal Medicine | Admitting: Internal Medicine

## 2016-06-16 ENCOUNTER — Encounter (HOSPITAL_COMMUNITY): Payer: Self-pay

## 2016-06-16 ENCOUNTER — Emergency Department (HOSPITAL_COMMUNITY): Payer: Medicare Other

## 2016-06-16 DIAGNOSIS — J9600 Acute respiratory failure, unspecified whether with hypoxia or hypercapnia: Secondary | ICD-10-CM

## 2016-06-16 DIAGNOSIS — Z9049 Acquired absence of other specified parts of digestive tract: Secondary | ICD-10-CM | POA: Diagnosis not present

## 2016-06-16 DIAGNOSIS — I10 Essential (primary) hypertension: Secondary | ICD-10-CM | POA: Diagnosis present

## 2016-06-16 DIAGNOSIS — A419 Sepsis, unspecified organism: Secondary | ICD-10-CM | POA: Diagnosis present

## 2016-06-16 DIAGNOSIS — J440 Chronic obstructive pulmonary disease with acute lower respiratory infection: Secondary | ICD-10-CM | POA: Diagnosis present

## 2016-06-16 DIAGNOSIS — J80 Acute respiratory distress syndrome: Secondary | ICD-10-CM | POA: Diagnosis not present

## 2016-06-16 DIAGNOSIS — J84114 Acute interstitial pneumonitis: Secondary | ICD-10-CM | POA: Diagnosis present

## 2016-06-16 DIAGNOSIS — J9601 Acute respiratory failure with hypoxia: Secondary | ICD-10-CM | POA: Diagnosis present

## 2016-06-16 DIAGNOSIS — F200 Paranoid schizophrenia: Secondary | ICD-10-CM | POA: Diagnosis present

## 2016-06-16 DIAGNOSIS — R911 Solitary pulmonary nodule: Secondary | ICD-10-CM | POA: Diagnosis not present

## 2016-06-16 DIAGNOSIS — D72829 Elevated white blood cell count, unspecified: Secondary | ICD-10-CM | POA: Diagnosis not present

## 2016-06-16 DIAGNOSIS — R7989 Other specified abnormal findings of blood chemistry: Secondary | ICD-10-CM | POA: Diagnosis present

## 2016-06-16 DIAGNOSIS — D649 Anemia, unspecified: Secondary | ICD-10-CM | POA: Diagnosis present

## 2016-06-16 DIAGNOSIS — Z8249 Family history of ischemic heart disease and other diseases of the circulatory system: Secondary | ICD-10-CM

## 2016-06-16 DIAGNOSIS — R590 Localized enlarged lymph nodes: Secondary | ICD-10-CM | POA: Diagnosis not present

## 2016-06-16 DIAGNOSIS — J9691 Respiratory failure, unspecified with hypoxia: Secondary | ICD-10-CM | POA: Diagnosis present

## 2016-06-16 DIAGNOSIS — I2510 Atherosclerotic heart disease of native coronary artery without angina pectoris: Secondary | ICD-10-CM | POA: Diagnosis present

## 2016-06-16 DIAGNOSIS — F17210 Nicotine dependence, cigarettes, uncomplicated: Secondary | ICD-10-CM | POA: Diagnosis present

## 2016-06-16 DIAGNOSIS — Z888 Allergy status to other drugs, medicaments and biological substances status: Secondary | ICD-10-CM

## 2016-06-16 DIAGNOSIS — R069 Unspecified abnormalities of breathing: Secondary | ICD-10-CM

## 2016-06-16 DIAGNOSIS — E871 Hypo-osmolality and hyponatremia: Secondary | ICD-10-CM | POA: Diagnosis present

## 2016-06-16 DIAGNOSIS — Z8042 Family history of malignant neoplasm of prostate: Secondary | ICD-10-CM

## 2016-06-16 DIAGNOSIS — J441 Chronic obstructive pulmonary disease with (acute) exacerbation: Secondary | ICD-10-CM | POA: Diagnosis present

## 2016-06-16 DIAGNOSIS — Z66 Do not resuscitate: Secondary | ICD-10-CM | POA: Diagnosis present

## 2016-06-16 DIAGNOSIS — I700 Atherosclerosis of aorta: Secondary | ICD-10-CM | POA: Diagnosis not present

## 2016-06-16 DIAGNOSIS — J82 Pulmonary eosinophilia, not elsewhere classified: Secondary | ICD-10-CM | POA: Diagnosis present

## 2016-06-16 DIAGNOSIS — Z85820 Personal history of malignant melanoma of skin: Secondary | ICD-10-CM | POA: Diagnosis not present

## 2016-06-16 DIAGNOSIS — R0902 Hypoxemia: Secondary | ICD-10-CM

## 2016-06-16 DIAGNOSIS — G609 Hereditary and idiopathic neuropathy, unspecified: Secondary | ICD-10-CM | POA: Diagnosis present

## 2016-06-16 DIAGNOSIS — J189 Pneumonia, unspecified organism: Secondary | ICD-10-CM | POA: Diagnosis present

## 2016-06-16 DIAGNOSIS — G629 Polyneuropathy, unspecified: Secondary | ICD-10-CM | POA: Diagnosis present

## 2016-06-16 DIAGNOSIS — E876 Hypokalemia: Secondary | ICD-10-CM | POA: Diagnosis present

## 2016-06-16 DIAGNOSIS — L8990 Pressure ulcer of unspecified site, unspecified stage: Secondary | ICD-10-CM | POA: Insufficient documentation

## 2016-06-16 DIAGNOSIS — J9621 Acute and chronic respiratory failure with hypoxia: Secondary | ICD-10-CM | POA: Diagnosis not present

## 2016-06-16 DIAGNOSIS — J849 Interstitial pulmonary disease, unspecified: Secondary | ICD-10-CM | POA: Diagnosis not present

## 2016-06-16 DIAGNOSIS — F17200 Nicotine dependence, unspecified, uncomplicated: Secondary | ICD-10-CM | POA: Diagnosis present

## 2016-06-16 DIAGNOSIS — Z79899 Other long term (current) drug therapy: Secondary | ICD-10-CM

## 2016-06-16 DIAGNOSIS — I776 Arteritis, unspecified: Secondary | ICD-10-CM | POA: Diagnosis present

## 2016-06-16 LAB — CBC WITH DIFFERENTIAL/PLATELET
BASOS ABS: 0 10*3/uL (ref 0.0–0.1)
Basophils Relative: 0 %
Eosinophils Absolute: 0 10*3/uL (ref 0.0–0.7)
Eosinophils Relative: 0 %
HEMATOCRIT: 34.9 % — AB (ref 39.0–52.0)
Hemoglobin: 12.8 g/dL — ABNORMAL LOW (ref 13.0–17.0)
LYMPHS ABS: 1 10*3/uL (ref 0.7–4.0)
LYMPHS PCT: 4 %
MCH: 32.2 pg (ref 26.0–34.0)
MCHC: 36.7 g/dL — ABNORMAL HIGH (ref 30.0–36.0)
MCV: 87.7 fL (ref 78.0–100.0)
MONO ABS: 1.1 10*3/uL — AB (ref 0.1–1.0)
Monocytes Relative: 4 %
NEUTROS ABS: 24.5 10*3/uL — AB (ref 1.7–7.7)
Neutrophils Relative %: 92 %
Platelets: 385 10*3/uL (ref 150–400)
RBC: 3.98 MIL/uL — ABNORMAL LOW (ref 4.22–5.81)
RDW: 13 % (ref 11.5–15.5)
WBC: 26.6 10*3/uL — ABNORMAL HIGH (ref 4.0–10.5)

## 2016-06-16 LAB — BLOOD GAS, ARTERIAL
Acid-base deficit: 3 mmol/L — ABNORMAL HIGH (ref 0.0–2.0)
Bicarbonate: 20.2 mmol/L (ref 20.0–28.0)
DELIVERY SYSTEMS: POSITIVE
EXPIRATORY PAP: 5
FIO2: 0.5
INSPIRATORY PAP: 10
Mode: POSITIVE
O2 Saturation: 94.1 %
Patient temperature: 98.6
pCO2 arterial: 32.6 mmHg (ref 32.0–48.0)
pH, Arterial: 7.409 (ref 7.350–7.450)
pO2, Arterial: 77.7 mmHg — ABNORMAL LOW (ref 83.0–108.0)

## 2016-06-16 LAB — BASIC METABOLIC PANEL
ANION GAP: 10 (ref 5–15)
BUN: 14 mg/dL (ref 6–20)
CO2: 23 mmol/L (ref 22–32)
Calcium: 8.1 mg/dL — ABNORMAL LOW (ref 8.9–10.3)
Chloride: 88 mmol/L — ABNORMAL LOW (ref 101–111)
Creatinine, Ser: 0.76 mg/dL (ref 0.61–1.24)
GFR calc Af Amer: >60 mL/min (ref >60)
Glucose, Bld: 125 mg/dL — ABNORMAL HIGH (ref 65–99)
POTASSIUM: 4.3 mmol/L (ref 3.5–5.1)
SODIUM: 121 mmol/L — AB (ref 135–145)

## 2016-06-16 LAB — URINALYSIS, ROUTINE W REFLEX MICROSCOPIC
BILIRUBIN URINE: NEGATIVE
Glucose, UA: NEGATIVE mg/dL
Ketones, ur: NEGATIVE mg/dL
Leukocytes, UA: NEGATIVE
NITRITE: NEGATIVE
PROTEIN: NEGATIVE mg/dL
Specific Gravity, Urine: 1.009 (ref 1.005–1.030)
pH: 6.5 (ref 5.0–8.0)

## 2016-06-16 LAB — INFLUENZA PANEL BY PCR (TYPE A & B)
Influenza A By PCR: NEGATIVE
Influenza B By PCR: NEGATIVE

## 2016-06-16 LAB — URINE MICROSCOPIC-ADD ON

## 2016-06-16 LAB — HEPATIC FUNCTION PANEL
ALT: 36 U/L (ref 17–63)
AST: 81 U/L — ABNORMAL HIGH (ref 15–41)
Albumin: 3 g/dL — ABNORMAL LOW (ref 3.5–5.0)
Alkaline Phosphatase: 77 U/L (ref 38–126)
BILIRUBIN INDIRECT: 1.5 mg/dL — AB (ref 0.3–0.9)
Bilirubin, Direct: 0.5 mg/dL (ref 0.1–0.5)
TOTAL PROTEIN: 5.9 g/dL — AB (ref 6.5–8.1)
Total Bilirubin: 2 mg/dL — ABNORMAL HIGH (ref 0.3–1.2)

## 2016-06-16 LAB — I-STAT TROPONIN, ED: Troponin i, poc: 0.05 ng/mL (ref 0.00–0.08)

## 2016-06-16 LAB — I-STAT CG4 LACTIC ACID, ED
LACTIC ACID, VENOUS: 1.88 mmol/L (ref 0.5–1.9)
Lactic Acid, Venous: 1.28 mmol/L (ref 0.5–1.9)

## 2016-06-16 LAB — MRSA PCR SCREENING: MRSA BY PCR: NEGATIVE

## 2016-06-16 LAB — BRAIN NATRIURETIC PEPTIDE: B Natriuretic Peptide: 220.3 pg/mL — ABNORMAL HIGH (ref 0.0–100.0)

## 2016-06-16 MED ORDER — SODIUM CHLORIDE 0.9 % IV BOLUS (SEPSIS)
1000.0000 mL | Freq: Once | INTRAVENOUS | Status: AC
  Administered 2016-06-16: 1000 mL via INTRAVENOUS

## 2016-06-16 MED ORDER — LEVOFLOXACIN IN D5W 750 MG/150ML IV SOLN: 750.0000 mg | INTRAVENOUS | Status: DC

## 2016-06-16 MED ORDER — TIOTROPIUM BROMIDE MONOHYDRATE 18 MCG IN CAPS
18.0000 ug | ORAL_CAPSULE | Freq: Every evening | RESPIRATORY_TRACT | Status: DC
  Filled 2016-06-16: qty 5

## 2016-06-16 MED ORDER — SODIUM CHLORIDE 0.9 % IV SOLN
INTRAVENOUS | Status: DC
  Administered 2016-06-16 – 2016-06-17 (×3): via INTRAVENOUS

## 2016-06-16 MED ORDER — FLUPHENAZINE DECANOATE 25 MG/ML IJ SOLN: 75.0000 mg | INTRAMUSCULAR | Status: DC

## 2016-06-16 MED ORDER — SERTRALINE HCL 50 MG PO TABS
50.0000 mg | ORAL_TABLET | Freq: Every day | ORAL | Status: DC
  Administered 2016-06-16 – 2016-06-20 (×5): 50 mg via ORAL
  Filled 2016-06-16 (×5): qty 1

## 2016-06-16 MED ORDER — ATENOLOL 25 MG PO TABS
25.0000 mg | ORAL_TABLET | Freq: Two times a day (BID) | ORAL | Status: DC
  Administered 2016-06-16 – 2016-06-24 (×17): 25 mg via ORAL
  Filled 2016-06-16 (×17): qty 1

## 2016-06-16 MED ORDER — PREDNISONE 20 MG PO TABS
60.0000 mg | ORAL_TABLET | Freq: Every day | ORAL | Status: DC
  Administered 2016-06-16: 60 mg via ORAL
  Filled 2016-06-16: qty 3

## 2016-06-16 MED ORDER — LEVOFLOXACIN IN D5W 750 MG/150ML IV SOLN
750.0000 mg | Freq: Once | INTRAVENOUS | Status: AC
  Administered 2016-06-16: 750 mg via INTRAVENOUS
  Filled 2016-06-16: qty 150

## 2016-06-16 MED ORDER — BUSPIRONE HCL 10 MG PO TABS
10.0000 mg | ORAL_TABLET | Freq: Three times a day (TID) | ORAL | Status: DC
  Administered 2016-06-16 – 2016-06-24 (×26): 10 mg via ORAL
  Filled 2016-06-16 (×6): qty 1
  Filled 2016-06-16 (×2): qty 2
  Filled 2016-06-16 (×6): qty 1
  Filled 2016-06-16: qty 2
  Filled 2016-06-16 (×2): qty 1
  Filled 2016-06-16: qty 2
  Filled 2016-06-16 (×2): qty 1
  Filled 2016-06-16: qty 2
  Filled 2016-06-16 (×5): qty 1

## 2016-06-16 MED ORDER — METHYLPREDNISOLONE SODIUM SUCC 125 MG IJ SOLR
125.0000 mg | Freq: Once | INTRAMUSCULAR | Status: AC
  Administered 2016-06-16: 125 mg via INTRAVENOUS
  Filled 2016-06-16: qty 2

## 2016-06-16 MED ORDER — LEVOFLOXACIN IN D5W 750 MG/150ML IV SOLN
750.0000 mg | INTRAVENOUS | Status: DC
  Administered 2016-06-17 – 2016-06-18 (×2): 750 mg via INTRAVENOUS
  Filled 2016-06-16 (×2): qty 150

## 2016-06-16 MED ORDER — ENOXAPARIN SODIUM 40 MG/0.4ML ~~LOC~~ SOLN
40.0000 mg | SUBCUTANEOUS | Status: DC
  Administered 2016-06-17 – 2016-06-24 (×8): 40 mg via SUBCUTANEOUS
  Filled 2016-06-16 (×9): qty 0.4

## 2016-06-16 MED ORDER — CLONAZEPAM 1 MG PO TABS
1.0000 mg | ORAL_TABLET | Freq: Two times a day (BID) | ORAL | Status: DC
  Administered 2016-06-16 – 2016-06-24 (×17): 1 mg via ORAL
  Filled 2016-06-16 (×17): qty 1

## 2016-06-16 MED ORDER — IPRATROPIUM-ALBUTEROL 0.5-2.5 (3) MG/3ML IN SOLN
3.0000 mL | Freq: Once | RESPIRATORY_TRACT | Status: AC
  Administered 2016-06-16: 3 mL via RESPIRATORY_TRACT
  Filled 2016-06-16: qty 3

## 2016-06-16 MED ORDER — SODIUM CHLORIDE 0.9% FLUSH
3.0000 mL | Freq: Two times a day (BID) | INTRAVENOUS | Status: DC
  Administered 2016-06-16 – 2016-06-24 (×13): 3 mL via INTRAVENOUS

## 2016-06-16 MED ORDER — ENOXAPARIN SODIUM 40 MG/0.4ML ~~LOC~~ SOLN
40.0000 mg | Freq: Every day | SUBCUTANEOUS | Status: DC
  Administered 2016-06-16: 40 mg via SUBCUTANEOUS
  Filled 2016-06-16: qty 0.4

## 2016-06-16 MED ORDER — BENZTROPINE MESYLATE 0.5 MG PO TABS
2.0000 mg | ORAL_TABLET | Freq: Every day | ORAL | Status: DC
  Administered 2016-06-16 – 2016-06-24 (×9): 2 mg via ORAL
  Filled 2016-06-16 (×9): qty 4

## 2016-06-16 MED ORDER — CYCLOBENZAPRINE HCL 5 MG PO TABS: 5.0000 mg | ORAL_TABLET | Freq: Three times a day (TID) | ORAL | Status: DC | PRN

## 2016-06-16 NOTE — ED Notes (Addendum)
Pt from [facility] in for anxiety initially. Upon triage was found to have an 02 sat of 73%. Pt appears To be tachypnic and in some degree of respiratory distress, but calm. Pt now has sats of 92% on 15L nonrebreather. History of cancer and lung mass.

## 2016-06-16 NOTE — H&P (Addendum)
[facility] History and Physical  Jeffrey Ochoa [id number] DOB: 1955/08/13 DOA: 06/16/2016  Referring physician: Laneta Simmers, ED PCP: Penni Homans, MD  Specialists: none yet  Chief Complaint:    HPI:  60 y/o  Known htn schizo-used to be under care of his parents but when they passed started to live at an assisted living Tob abuse Prior melanoma  Unspecified neuropathy  3-4 days cough Live sin ALF-no sick contacts No myalgia, some sputum No flu shot recently as doesn't take it No diarr No dysuria no blurred vision no double vision no uni8lat weak no cp no chills norrigor No fall no melena no vomit  Sodium 121 Bun/cre4at 14/ 0.76 Rand CBG 125  Total bili 2.0 Wbc 26 Hb 12.8   Aispace RUL and LLL   PH 7.38/CO232/Pao263  In ed given solumedrol 125 levaquin 750 IV and wght based FLuid resusc and nebs  Past Medical History:  Diagnosis Date  . Chicken pox as a child  . HTN (hypertension) 12/05/2013  . Hypertension   . Measles as a child  . Melanoma of back ([place])   . Mumps as a child  . Schizophrenia, paranoid ([place]) 1985  . Tobacco use disorder 12/05/2013   2 ppd  . Unspecified hereditary and idiopathic peripheral neuropathy 12/05/2013   Past Surgical History:  Procedure Laterality Date  . CHOLECYSTECTOMY  1985  . MOHS SURGERY     back  . MULTIPLE TOOTH EXTRACTIONS     has all teeth removed  . WISDOM TOOTH EXTRACTION     Social History:  Social History   Social History Narrative  . No narrative on file    Allergies  Allergen Reactions  . Risperidone And Related Other (See Comments)    Makes him Manic     Family History  Problem Relation Age of Onset  . Hypertension Mother   . Prostate cancer Father   . Aortic dissection Father   . Fibromyalgia Sister   . Hypertension Sister   . Hyperlipidemia Sister    Prior to Admission medications   Medication Sig Start Date End Date Taking? Authorizing Provider  acetaminophen (TYLENOL) 500 MG  tablet Take 1 tablet (500 mg total) by mouth every 6 (six) hours as needed for mild pain or moderate pain. 11/30/13   Mosie Lukes, MD  atenolol (TENORMIN) 25 MG tablet Take 25 mg by mouth 2 (two) times daily.    Historical Provider, MD  benztropine (COGENTIN) 2 MG tablet Take 2 mg by mouth daily.    Historical Provider, MD  clonazePAM (KLONOPIN) 1 MG tablet Take 1 mg by mouth 2 (two) times daily.    Historical Provider, MD  cyclobenzaprine (FLEXERIL) 5 MG tablet Take 1 tablet (5 mg total) by mouth 3 (three) times daily as needed for muscle spasms. 11/30/13   Mosie Lukes, MD  fluPHENAZine decanoate (PROLIXIN) 25 MG/ML injection Inject 75 mg into the muscle every 14 (fourteen) days.    Historical Provider, MD  sertraline (ZOLOFT) 50 MG tablet Take 50 mg by mouth daily.    Historical Provider, MD   Physical Exam: Vitals:   06/16/16 0500 06/16/16 0530 06/16/16 0600 06/16/16 0700  BP: 134/89 (!) 137/121 136/86 137/88  Pulse: 94 96 93 88  Resp: 26 (!) 32 (!) 27   Temp:      TempSrc:      SpO2: 93% 95% 96% 92%   Alert Examined off of Bipap Tires easily off of the bipap Chest crakles bilat lowe  rlung fields abd soft nt nd no rebound Moves limbs x 4equally Neuro intact approp answering q's No le edema ?JVD but habitus being skinny might falseyl elevate this  Labs on Admission:  Basic Metabolic Panel:  Recent Labs Lab 06/16/16 0456  NA 121*  K 4.3  CL 88*  CO2 23  GLUCOSE 125*  BUN 14  CREATININE 0.76  CALCIUM 8.1*   Liver Function Tests:  Recent Labs Lab 06/16/16 0456  AST 81*  ALT 36  ALKPHOS 77  BILITOT 2.0*  PROT 5.9*  ALBUMIN 3.0*   No results for input(s): LIPASE, AMYLASE in the last 168 hours. No results for input(s): AMMONIA in the last 168 hours. CBC:  Recent Labs Lab 06/16/16 0345  WBC 26.6*  NEUTROABS 24.5*  HGB 12.8*  HCT 34.9*  MCV 87.7  PLT 385   Cardiac Enzymes: No results for input(s): CKTOTAL, CKMB, CKMBINDEX, TROPONINI in the last 168  hours.  BNP (last 3 results)  Recent Labs  06/16/16 0346  BNP 220.3*    ProBNP (last 3 results) No results for input(s): PROBNP in the last 8760 hours.  CBG: No results for input(s): GLUCAP in the last 168 hours.  Radiological Exams on Admission: Dg Chest Port 1 View  Result Date: 06/16/2016 CLINICAL DATA:  Sudden onset worsening shortness of breath beginning 3 days ago. Chills and diaphoresis. Decreased oxygenation. History of hypertension, melanoma, tobacco abuse. EXAM: PORTABLE CHEST 1 VIEW COMPARISON:  04/05/2014 FINDINGS: Normal heart size and pulmonary vascularity. Areas of airspace infiltration demonstrated in the right upper lung and left mid and lower lung. Peribronchial thickening. Changes suspicious for multifocal pneumonia. No pneumothorax. Mediastinal contours appear intact. IMPRESSION: Airspace infiltrates in the right upper lung and left lower lung suspicious for multifocal pneumonia. Electronically Signed   By: Lucienne Capers M.D.   On: 06/16/2016 04:17    EKG: Independently reviewed. Pr interval 0.08QRS axis -15 Some slight St-T wave elevation V3-V6 but note body habitus No prior  ekg to Compare with  Assessment/Plan  Sepsis with multilobar PNA Continue IV levaquin for now.  Monitor CBC Fluids 125 cc/h Cbc + diff am monitor on SDU  Acute hypoxic resp failure See above Needs Bipap as gets tired and ?  WOB without Rpt Abg 0930 Might need at least 24 hrs on bipap If no better on ABG consult CCM  Hypovolemic hyponatremia IV fluid resususcitation Rpt labs this pm 13:00 If no better work up with urine studies Not eon numerous anti-psychotics which might have a bearing on this  Schizophrenia, ? Mental delay? Need sw and ALF screening Cogentin 2 daily, Klonopin 1 bid, Fluphenazine 75 q 14 day sertraline 50 od  Htn Cont Atenolol 25 bid  Current daily smoker/?COPD at [facility] No wheeze Give PO steroids 50 mg prednisone daily Monitor  breathing  Presumed full code 65 min SDU   Nita Sells [facility] Pager [phone]  If 7PM-7AM, please contact night-coverage [email or web address] Password [email or web address] 06/16/2016, 7:30 AM

## 2016-06-16 NOTE — ED Provider Notes (Signed)
[facility] DEPT Provider Note   CSN: [id number] Arrival date & time: 06/16/16  [id number] By signing my name below, I, Doran Stabler, attest that this documentation has been prepared under the direction and in the presence of Leo Grosser, MD. Electronically Signed: Doran Stabler, ED Scribe. 06/16/16. 3:39 AM.  History   Chief Complaint No chief complaint on file.  The history is provided by the patient and the EMS personnel.   HPI Comments: Jeffrey Ochoa is a 60 y.o. male who presents to the Emergency Department with a PMHx of COPD and HTN via EMS complaining of a sudden onset and worsening SOB that began 2 to 3 days ago. Pt also reports associated chills and diaphoresis. EMS states that as they ambulated the pt, his SpO2 dropped to the 70s. Pt does not wear oxygen at home. Pt denies any other symptoms at this time.   Past Medical History:  Diagnosis Date  . Chicken pox as a child  . HTN (hypertension) 12/05/2013  . Hypertension   . Measles as a child  . Melanoma of back   . Mumps as a child  . Schizophrenia, paranoid 1985  . Tobacco use disorder 12/05/2013   2 ppd  . Unspecified hereditary and idiopathic peripheral neuropathy 12/05/2013    Patient Active Problem List   Diagnosis Date Noted  . Other and unspecified hyperlipidemia 04/11/2014  . Lung mass 04/11/2014  . Unspecified vitamin D deficiency 04/11/2014  . Medicare annual wellness visit, subsequent 04/11/2014  . Tobacco use disorder 12/05/2013  . Unspecified hereditary and idiopathic peripheral neuropathy 12/05/2013  . HTN (hypertension) 12/05/2013  . Schizophrenia, paranoid ([place])   . Melanoma of back [facility])    Past Surgical History:  Procedure Laterality Date  . CHOLECYSTECTOMY  1985  . MOHS SURGERY     back  . MULTIPLE TOOTH EXTRACTIONS     has all teeth removed  . WISDOM TOOTH EXTRACTION      Home Medications    Prior to Admission medications   Medication Sig Start Date End Date Taking? Authorizing  Provider  acetaminophen (TYLENOL) 500 MG tablet Take 1 tablet (500 mg total) by mouth every 6 (six) hours as needed for mild pain or moderate pain. 11/30/13   Mosie Lukes, MD  atenolol (TENORMIN) 25 MG tablet Take 25 mg by mouth 2 (two) times daily.    Historical Provider, MD  benztropine (COGENTIN) 2 MG tablet Take 2 mg by mouth daily.    Historical Provider, MD  clonazePAM (KLONOPIN) 1 MG tablet Take 1 mg by mouth 2 (two) times daily.    Historical Provider, MD  cyclobenzaprine (FLEXERIL) 5 MG tablet Take 1 tablet (5 mg total) by mouth 3 (three) times daily as needed for muscle spasms. 11/30/13   Mosie Lukes, MD  fluPHENAZine decanoate (PROLIXIN) 25 MG/ML injection Inject 75 mg into the muscle every 14 (fourteen) days.    Historical Provider, MD  sertraline (ZOLOFT) 50 MG tablet Take 50 mg by mouth daily.    Historical Provider, MD   Family History Family History  Problem Relation Age of Onset  . Hypertension Mother   . Prostate cancer Father   . Aortic dissection Father   . Fibromyalgia Sister   . Hypertension Sister   . Hyperlipidemia Sister    Social History Social History  Substance Use Topics  . Smoking status: Current Every Day Smoker    Packs/day: 2.00    Types: Cigarettes  . Smokeless tobacco: Former Systems developer  .  Alcohol use No   Allergies   Risperidone and related  Review of Systems Review of Systems  Constitutional: Positive for chills and diaphoresis.  Respiratory: Positive for shortness of breath.   All other systems reviewed and are negative.  Physical Exam Updated Vital Signs BP 136/86   Pulse 93   Temp 98.1 F (36.7 C) (Oral)   Resp (!) 27   SpO2 96%   Physical Exam  Constitutional: He is oriented to person, place, and time. He appears well-developed and well-nourished. No distress.  HENT:  Head: Normocephalic and atraumatic.  Nose: Nose normal.  Eyes: Conjunctivae are normal.  Neck: Neck supple. No tracheal deviation present.  Cardiovascular:  Normal rate and regular rhythm.   Pulmonary/Chest: Accessory muscle usage present. Tachypnea noted. He is in respiratory distress.  Inspiratory and expiratory rales on the left. Right is clear.   Abdominal: Soft. He exhibits no distension.  Neurological: He is alert and oriented to person, place, and time.  Skin: Skin is warm and dry.  Psychiatric: He has a normal mood and affect.   ED Treatments / Results  DIAGNOSTIC STUDIES: Oxygen Saturation is 92% on 15 L via nonrebreather, adequate  by my interpretation.    COORDINATION OF CARE: 3:40 AM Discussed treatment plan with pt at bedside and pt agreed to plan.  Labs (all labs ordered are listed, but only abnormal results are displayed) Labs Reviewed  BRAIN NATRIURETIC PEPTIDE - Abnormal; Notable for the following:       Result Value   B Natriuretic Peptide 220.3 (*)    All other components within normal limits  CBC WITH DIFFERENTIAL/PLATELET - Abnormal; Notable for the following:    WBC 26.6 (*)    RBC 3.98 (*)    Hemoglobin 12.8 (*)    HCT 34.9 (*)    MCHC 36.7 (*)    Neutro Abs 24.5 (*)    Monocytes Absolute 1.1 (*)    All other components within normal limits  BLOOD GAS, VENOUS - Abnormal; Notable for the following:    pCO2, Ven 32.8 (*)    pO2, Ven 63.5 (*)    Bicarbonate 19.3 (*)    Acid-base deficit 4.5 (*)    All other components within normal limits  URINALYSIS, ROUTINE W REFLEX MICROSCOPIC (NOT AT [facility]) - Abnormal; Notable for the following:    Hgb urine dipstick LARGE (*)    All other components within normal limits  HEPATIC FUNCTION PANEL - Abnormal; Notable for the following:    Total Protein 5.9 (*)    Albumin 3.0 (*)    AST 81 (*)    Total Bilirubin 2.0 (*)    Indirect Bilirubin 1.5 (*)    All other components within normal limits  BASIC METABOLIC PANEL - Abnormal; Notable for the following:    Sodium 121 (*)    Chloride 88 (*)    Glucose, Bld 125 (*)    Calcium 8.1 (*)    All other components within  normal limits  URINE MICROSCOPIC-ADD ON - Abnormal; Notable for the following:    Squamous Epithelial / LPF 0-5 (*)    Bacteria, UA RARE (*)    All other components within normal limits  CULTURE, BLOOD (ROUTINE X 2)  CULTURE, BLOOD (ROUTINE X 2)  URINE CULTURE  MRSA PCR SCREENING  CBC  CREATININE, SERUM  I-STAT TROPOININ, ED  I-STAT CG4 LACTIC ACID, ED  I-STAT CG4 LACTIC ACID, ED    EKG  EKG Interpretation  Date/Time:  Sunday  June 16 2016 03:42:07 EST Ventricular Rate:  88 PR Interval:    QRS Duration: 108 QT Interval:  399 QTC Calculation: 483 R Axis:   -12 Text Interpretation:  Sinus rhythm Probable left atrial enlargement Borderline prolonged QT interval Otherwise normal ECG No previous tracing Confirmed by Amiera Herzberg MD, Quillian Quince [id number]) on 06/16/2016 3:47:58 AM Also confirmed by Laneta Simmers MD, Diasia Henken [id number]), editor [place]  CCT, [place] ([id number])  on 06/16/2016 7:29:23 AM       Radiology Dg Chest Port 1 View  Result Date: 06/16/2016 CLINICAL DATA:  Sudden onset worsening shortness of breath beginning 3 days ago. Chills and diaphoresis. Decreased oxygenation. History of hypertension, melanoma, tobacco abuse. EXAM: PORTABLE CHEST 1 VIEW COMPARISON:  04/05/2014 FINDINGS: Normal heart size and pulmonary vascularity. Areas of airspace infiltration demonstrated in the right upper lung and left mid and lower lung. Peribronchial thickening. Changes suspicious for multifocal pneumonia. No pneumothorax. Mediastinal contours appear intact. IMPRESSION: Airspace infiltrates in the right upper lung and left lower lung suspicious for multifocal pneumonia. Electronically Signed   By: Lucienne Capers M.D.   On: 06/16/2016 04:17    Procedures Procedures (including critical care time)  Medications Ordered in ED Medications  sodium chloride 0.9 % bolus 1,000 mL (0 mLs Intravenous Stopped 06/16/16 0533)    And  sodium chloride 0.9 % bolus 1,000 mL (0 mLs Intravenous Stopped 06/16/16 0624)     And  sodium chloride 0.9 % bolus 1,000 mL (1,000 mLs Intravenous New Bag/Given 06/16/16 0604)  ipratropium-albuterol (DUONEB) 0.5-2.5 (3) MG/3ML nebulizer solution 3 mL (3 mLs Nebulization Given 06/16/16 0352)  methylPREDNISolone sodium succinate (SOLU-MEDROL) 125 mg/2 mL injection 125 mg (125 mg Intravenous Given 06/16/16 0356)  levofloxacin (LEVAQUIN) IVPB 750 mg (0 mg Intravenous Stopped 06/16/16 0617)     Initial Impression / Assessment and Plan / ED Course  I have reviewed the triage vital signs and the nursing notes.  Pertinent labs & imaging results that were available during my care of the patient were reviewed by me and considered in my medical decision making (see chart for details).  Clinical Course    60 y.o. male presents with shortness of breath over last 2 days. Has h/o COPD and is heavy smoker. Severely hypoxemis into low 70s on arrival and symptomatic. Improved with nonrebreather but high O2 requirement. Placed on bipap with improved WOB and decreased O2 requirement with lung recruitment. Multifocal opacities and acute lymphocytosis suspicious for sepsis 2/2 multifocal CAP. Covered with levaquin and fluid resuscitated appropriately. No severe sepsis signs with normal lactate and no end-organ damage. Notable acute hyponatremia which will require slow correction as inpatient. Hospitalist was consulted for admission and will see the patient in the emergency department.   Final Clinical Impressions(s) / ED Diagnoses   Final diagnoses:  Community acquired pneumonia, unspecified laterality  Acute respiratory failure with hypoxemia (HCC)  COPD exacerbation (HCC)  Acute hyponatremia  Sepsis, due to unspecified organism [facility])    New Prescriptions New Prescriptions   No medications on file   I personally performed the services described in this documentation, which was scribed in my presence. The recorded information has been reviewed and is accurate.        Leo Grosser,  MD 06/16/16 [phone]

## 2016-06-17 ENCOUNTER — Encounter (HOSPITAL_COMMUNITY): Payer: Self-pay | Admitting: Family Medicine

## 2016-06-17 DIAGNOSIS — D72829 Elevated white blood cell count, unspecified: Secondary | ICD-10-CM | POA: Diagnosis present

## 2016-06-17 DIAGNOSIS — J441 Chronic obstructive pulmonary disease with (acute) exacerbation: Secondary | ICD-10-CM

## 2016-06-17 LAB — BLOOD GAS, VENOUS
ACID-BASE DEFICIT: 4.5 mmol/L — AB (ref 0.0–2.0)
BICARBONATE: 19.3 mmol/L — AB (ref 20.0–28.0)
FIO2: 100
O2 Content: 12 L/min
O2 Saturation: 89.7 %
PATIENT TEMPERATURE: 98.2
pCO2, Ven: 32.8 mmHg — ABNORMAL LOW (ref 44.0–60.0)
pH, Ven: 7.386 (ref 7.250–7.430)
pO2, Ven: 63.5 mmHg — ABNORMAL HIGH (ref 32.0–45.0)

## 2016-06-17 LAB — CBC
HEMATOCRIT: 29.7 % — AB (ref 39.0–52.0)
HEMOGLOBIN: 10.3 g/dL — AB (ref 13.0–17.0)
MCH: 31.7 pg (ref 26.0–34.0)
MCHC: 34.7 g/dL (ref 30.0–36.0)
MCV: 91.4 fL (ref 78.0–100.0)
Platelets: 305 10*3/uL (ref 150–400)
RBC: 3.25 MIL/uL — ABNORMAL LOW (ref 4.22–5.81)
RDW: 13.4 % (ref 11.5–15.5)
WBC: 25.5 10*3/uL — ABNORMAL HIGH (ref 4.0–10.5)

## 2016-06-17 LAB — URINE CULTURE: CULTURE: NO GROWTH

## 2016-06-17 LAB — COMPREHENSIVE METABOLIC PANEL
ALK PHOS: 56 U/L (ref 38–126)
ALT: 33 U/L (ref 17–63)
ANION GAP: 6 (ref 5–15)
AST: 43 U/L — ABNORMAL HIGH (ref 15–41)
Albumin: 2.3 g/dL — ABNORMAL LOW (ref 3.5–5.0)
BILIRUBIN TOTAL: 0.9 mg/dL (ref 0.3–1.2)
BUN: 13 mg/dL (ref 6–20)
CALCIUM: 7.7 mg/dL — AB (ref 8.9–10.3)
CO2: 25 mmol/L (ref 22–32)
Chloride: 106 mmol/L (ref 101–111)
Creatinine, Ser: 0.6 mg/dL — ABNORMAL LOW (ref 0.61–1.24)
Glucose, Bld: 155 mg/dL — ABNORMAL HIGH (ref 65–99)
POTASSIUM: 3.4 mmol/L — AB (ref 3.5–5.1)
Sodium: 137 mmol/L (ref 135–145)
TOTAL PROTEIN: 4.9 g/dL — AB (ref 6.5–8.1)

## 2016-06-17 LAB — PROTIME-INR
INR: 1.32
PROTHROMBIN TIME: 16.4 s — AB (ref 11.4–15.2)

## 2016-06-17 MED ORDER — TIOTROPIUM BROMIDE MONOHYDRATE 18 MCG IN CAPS
18.0000 ug | ORAL_CAPSULE | Freq: Every day | RESPIRATORY_TRACT | Status: DC
  Administered 2016-06-20 – 2016-06-24 (×4): 18 ug via RESPIRATORY_TRACT
  Filled 2016-06-17 (×3): qty 5

## 2016-06-17 MED ORDER — POTASSIUM CHLORIDE IN NACL 40-0.9 MEQ/L-% IV SOLN
INTRAVENOUS | Status: AC
  Administered 2016-06-17 (×2): 75 mL/h via INTRAVENOUS
  Filled 2016-06-17 (×2): qty 1000

## 2016-06-17 MED ORDER — PREDNISONE 20 MG PO TABS
40.0000 mg | ORAL_TABLET | Freq: Every day | ORAL | Status: DC
  Administered 2016-06-17 – 2016-06-18 (×2): 40 mg via ORAL
  Filled 2016-06-17 (×2): qty 2

## 2016-06-17 MED ORDER — NICOTINE 21 MG/24HR TD PT24
21.0000 mg | MEDICATED_PATCH | Freq: Every day | TRANSDERMAL | Status: DC
  Administered 2016-06-17 – 2016-06-24 (×8): 21 mg via TRANSDERMAL
  Filled 2016-06-17 (×9): qty 1

## 2016-06-17 MED ORDER — POTASSIUM CHLORIDE IN NACL 20-0.9 MEQ/L-% IV SOLN
INTRAVENOUS | Status: DC
Start: 2016-06-17 — End: 2016-06-17

## 2016-06-17 MED ORDER — ACETAMINOPHEN 325 MG PO TABS
650.0000 mg | ORAL_TABLET | ORAL | Status: DC | PRN
  Administered 2016-06-17 – 2016-06-20 (×2): 650 mg via ORAL
  Filled 2016-06-17 (×2): qty 2

## 2016-06-17 NOTE — Progress Notes (Signed)
Pt is comfortable and stable with vital signs as charted on nonrebreather oxygen mask.  Pt not in any respiratory distress at this time.  RT will continue to monitor for BiPAP need.

## 2016-06-17 NOTE — Progress Notes (Signed)
Pt O2 level staying in the upper 70's- mid 80's on 15L HFNC.  Work of breathing increased as well.  Pt placed back on Bipap, Respiratory made aware.  Will continue to monitor.

## 2016-06-17 NOTE — Progress Notes (Signed)
Pt stated he was feeling claustrophobic and couldn't breathe with the Bipap on.  Pt switched back over to 15 L HFNC.  Pt educated that if O2 levels were not controlled by the HFNC then he will need to be placed back on Bipap. Pt verbalized understanding.  Will continue to monitor.

## 2016-06-17 NOTE — Progress Notes (Signed)
Pt placed on Non-Rebreather. HFNC not keeping O2 sats at an appropriate level.  Will continue to monitor.

## 2016-06-17 NOTE — Progress Notes (Addendum)
PROGRESS NOTE    ALP FAUCETTE  [id number]  DOB: 30-Jul-1956  DOA: 06/16/2016 PCP: Penni Homans, MD Outpatient Specialists:   Hospital course: 60 y/o male heavy chronic smoker presented from ALF with progressive SOB, cough, fever and found in ED to have multifocal pneumonia, requiring bipap therapy, also has hyponatremia.  Pt has paranoid schizophrenia.    Assessment & Plan:    Acute hypoxic Respiratory Failure - Pt clinically is much improved after bipap therapy, now weaned down to nasal cannula, continue current management of pneumonia and COPD.    Multifocal Pneumonia - Clinically much improved, continue levofloxacin therapy.    Acute Exacerbation of COPD - continue prednisone therapy, nebs as needed, supplemental oxygen therapy, likely will need home oxygen therapy.   Hypovolemic Hyponatremia - improved with IVF hydration with NS.    Leukocytosis - likely exacerbated by steroids and pneumonia.    Hypokalemia - Adding KCl to IVFs.    Paranoid Schizophrenia - continue home medications, currently controlled, at baseline.   Essential Hypertension - resume home medications, follow.    Elevated liver Enzymes and Hyperbilirubinemia - improved with IVF hydration.    DVT prophylaxis: enoxaparin Code Status: Full Disposition Plan: Pt likely to return to ALF at discharge  Subjective: Pt reports that he is breathing much better and not needing to have bipap therapy.   Objective: Vitals:   06/17/16 0320 06/17/16 0400 06/17/16 0652 06/17/16 0722  BP:  96/73    Pulse:  82  87  Resp:  (!) 31  (!) 25  Temp: 98.1 F (36.7 C)  97.9 F (36.6 C)   TempSrc: Axillary  Oral   SpO2:  93%  90%  Weight:      Height:        Intake/Output Summary (Last 24 hours) at 06/17/16 0738 Last data filed at 06/17/16 0537  Gross per 24 hour  Intake             3335 ml  Output             5125 ml  Net            -1790 ml   Filed Weights   06/16/16 0800  Weight: 78.7 kg (173 lb 8  oz)    Exam:  General exam: thin male, alert, in no distress.  Respiratory system: Mostly clear, occasional wheeze and rale. No increased work of breathing. Cardiovascular system: S1 & S2 heard, RRR. No JVD, murmurs, gallops, clicks or pedal edema. Gastrointestinal system: Abdomen is nondistended, soft and nontender. Normal bowel sounds heard. Central nervous system: Alert and oriented. No focal neurological deficits. Extremities: no CCE.  Data Reviewed: Basic Metabolic Panel:  Recent Labs Lab 06/16/16 0456 06/17/16 0332  NA 121* 137  K 4.3 3.4*  CL 88* 106  CO2 23 25  GLUCOSE 125* 155*  BUN 14 13  CREATININE 0.76 0.60*  CALCIUM 8.1* 7.7*   Liver Function Tests:  Recent Labs Lab 06/16/16 0456 06/17/16 0332  AST 81* 43*  ALT 36 33  ALKPHOS 77 56  BILITOT 2.0* 0.9  PROT 5.9* 4.9*  ALBUMIN 3.0* 2.3*   No results for input(s): LIPASE, AMYLASE in the last 168 hours. No results for input(s): AMMONIA in the last 168 hours. CBC:  Recent Labs Lab 06/16/16 0345 06/17/16 0332  WBC 26.6* 25.5*  NEUTROABS 24.5*  --   HGB 12.8* 10.3*  HCT 34.9* 29.7*  MCV 87.7 91.4  PLT 385 305   Cardiac Enzymes: No  results for input(s): CKTOTAL, CKMB, CKMBINDEX, TROPONINI in the last 168 hours. CBG (last 3)  No results for input(s): GLUCAP in the last 72 hours. Recent Results (from the past 240 hour(s))  MRSA PCR Screening     Status: None   Collection Time: 06/16/16  8:01 AM  Result Value Ref Range Status   MRSA by PCR NEGATIVE NEGATIVE Final    Comment:        The GeneXpert MRSA Assay (FDA approved for NASAL specimens only), is one component of a comprehensive MRSA colonization surveillance program. It is not intended to diagnose MRSA infection nor to guide or monitor treatment for MRSA infections.      Studies: Dg Chest Port 1 View  Result Date: 06/16/2016 CLINICAL DATA:  Sudden onset worsening shortness of breath beginning 3 days ago. Chills and diaphoresis.  Decreased oxygenation. History of hypertension, melanoma, tobacco abuse. EXAM: PORTABLE CHEST 1 VIEW COMPARISON:  04/05/2014 FINDINGS: Normal heart size and pulmonary vascularity. Areas of airspace infiltration demonstrated in the right upper lung and left mid and lower lung. Peribronchial thickening. Changes suspicious for multifocal pneumonia. No pneumothorax. Mediastinal contours appear intact. IMPRESSION: Airspace infiltrates in the right upper lung and left lower lung suspicious for multifocal pneumonia. Electronically Signed   By: Lucienne Capers M.D.   On: 06/16/2016 04:17     Scheduled Meds: . atenolol  25 mg Oral BID  . benztropine  2 mg Oral Daily  . busPIRone  10 mg Oral TID  . clonazePAM  1 mg Oral BID  . enoxaparin (LOVENOX) injection  40 mg Subcutaneous Q24H  . enoxaparin (LOVENOX) injection  40 mg Subcutaneous Daily  . [START ON 06/23/2016] fluPHENAZine decanoate  75 mg Intramuscular Q14 Days  . levofloxacin (LEVAQUIN) IV  750 mg Intravenous Q24H  . predniSONE  60 mg Oral QAC breakfast  . sertraline  50 mg Oral Daily  . sodium chloride flush  3 mL Intravenous Q12H  . tiotropium  18 mcg Inhalation QPM   Continuous Infusions: . sodium chloride 125 mL/hr at 06/17/16 0017    Active Problems:   Schizophrenia, paranoid ([place])   Tobacco use disorder   HTN (hypertension)   Respiratory failure with hypoxia (HCC)   Multifocal pneumonia   Leukocytosis   COPD exacerbation [facility])  Critical Care Time spent: 35 mins  Irwin Brakeman, MD, FAAFP [facility] Pager [phone]  If 7PM-7AM, please contact night-coverage [email or web address] Password [email or web address] 06/17/2016, 7:38 AM    LOS: 1 day

## 2016-06-17 NOTE — Clinical Social Work Note (Signed)
Clinical Social Work Assessment  Patient Details  Name: Jeffrey Ochoa MRN: [id number] Date of Birth: 06/17/1956  Date of referral:  06/17/16               Reason for consult:  Facility Placement, Discharge Planning                Permission sought to share information with:  Facility Art therapist granted to share information::  Yes, Verbal Permission Granted  Name::        Agency::     Relationship::     Contact Information:     Housing/Transportation Living arrangements for the past 2 months:  [place] of Information:  Patient, Facility Patient Interpreter Needed:  None Criminal Activity/Legal Involvement Pertinent to Current Situation/Hospitalization:  No - Comment as needed Significant Relationships:  Siblings Lives with:  Facility Resident Do you feel safe going back to the place where you live?  Yes Need for family participation in patient care:  Yes (Comment)  Care giving concerns:Pt's sister is concerned that pt may try to smoke while at hospital. Pt's nsg has been alerted.    Social Worker assessment / plan:  Pt hospitalized on 06/16/16 from [facility] ALF with Acute hypoxic Respiratory Failure. CSW met with pt and spoke with pt's sister Jeffrey Ochoa [phone] to assist with d/c planning. Pt plans to return to [facility] at d/c. Wells Guiles from ALF has confirmed d/c plan. CSW has sent clinicals to [facility] for review. CSW will continue to follow to assist with d/c planning to [facility] at d/c.  Employment status:  Disabled (Comment on whether or not currently receiving Disability) Insurance information:  Medicare PT Recommendations:  Not assessed at this time Information / Referral to community resources:     Patient/Family's Response to care:  Pt / sister in agreement for pt to return to [employer] at d/c.   Patient/Family's Understanding of and Emotional Response to Diagnosis, Current Treatment, and Prognosis:  " I'm  feeling much better than I did yesterday. " Pt's sister reports that she wasn't aware of how ill her brother was prior to admission. CSW provided # for sister to contact pt's nsg for update.  Emotional Assessment Appearance:  Appears stated age Attitude/Demeanor/Rapport:  Other (cooperative) Affect (typically observed):  Appropriate, Pleasant Orientation:  Oriented to Self, Oriented to Place, Oriented to  Time, Oriented to Situation Alcohol / Substance use:  Not Applicable Psych involvement (Current and /or in the community):  No (Comment)  Discharge Needs  Concerns to be addressed:  Discharge Planning Concerns Readmission within the last 30 days:  No Current discharge risk:  None Barriers to Discharge:  No Barriers Identified   Luretha Rued, [place] 06/17/2016, 3:14 PM

## 2016-06-18 ENCOUNTER — Inpatient Hospital Stay (HOSPITAL_COMMUNITY): Payer: Medicare Other

## 2016-06-18 LAB — BLOOD GAS, ARTERIAL
Acid-Base Excess: 2.4 mmol/L — ABNORMAL HIGH (ref 0.0–2.0)
BICARBONATE: 27.3 mmol/L (ref 20.0–28.0)
DRAWN BY: 441261
Delivery systems: POSITIVE
Expiratory PAP: 7
FIO2: 60
Inspiratory PAP: 12
LHR: 8 {breaths}/min
O2 SAT: 93.6 %
PATIENT TEMPERATURE: 98.6
PCO2 ART: 45.9 mmHg (ref 32.0–48.0)
PO2 ART: 72.6 mmHg — AB (ref 83.0–108.0)
pH, Arterial: 7.392 (ref 7.350–7.450)

## 2016-06-18 LAB — CBC
HCT: 33.5 % — ABNORMAL LOW (ref 39.0–52.0)
Hemoglobin: 11 g/dL — ABNORMAL LOW (ref 13.0–17.0)
MCH: 31.1 pg (ref 26.0–34.0)
MCHC: 32.8 g/dL (ref 30.0–36.0)
MCV: 94.6 fL (ref 78.0–100.0)
PLATELETS: 225 10*3/uL (ref 150–400)
RBC: 3.54 MIL/uL — AB (ref 4.22–5.81)
RDW: 13.7 % (ref 11.5–15.5)
WBC: 26.9 10*3/uL — AB (ref 4.0–10.5)

## 2016-06-18 LAB — BASIC METABOLIC PANEL
ANION GAP: 8 (ref 5–15)
BUN: 11 mg/dL (ref 6–20)
CO2: 27 mmol/L (ref 22–32)
Calcium: 7.8 mg/dL — ABNORMAL LOW (ref 8.9–10.3)
Chloride: 102 mmol/L (ref 101–111)
Creatinine, Ser: 0.58 mg/dL — ABNORMAL LOW (ref 0.61–1.24)
GFR calc Af Amer: >60 mL/min (ref >60)
Glucose, Bld: 126 mg/dL — ABNORMAL HIGH (ref 65–99)
POTASSIUM: 3.6 mmol/L (ref 3.5–5.1)
SODIUM: 137 mmol/L (ref 135–145)

## 2016-06-18 MED ORDER — PIPERACILLIN-TAZOBACTAM 3.375 G IVPB
3.3750 g | Freq: Three times a day (TID) | INTRAVENOUS | Status: DC
  Administered 2016-06-18 – 2016-06-22 (×13): 3.375 g via INTRAVENOUS
  Filled 2016-06-18 (×13): qty 50

## 2016-06-18 MED ORDER — IPRATROPIUM-ALBUTEROL 0.5-2.5 (3) MG/3ML IN SOLN: 3.0000 mL | RESPIRATORY_TRACT | Status: DC | PRN

## 2016-06-18 MED ORDER — METHYLPREDNISOLONE SODIUM SUCC 125 MG IJ SOLR
80.0000 mg | Freq: Once | INTRAMUSCULAR | Status: AC
  Administered 2016-06-18: 80 mg via INTRAVENOUS
  Filled 2016-06-18: qty 2

## 2016-06-18 MED ORDER — POTASSIUM CHLORIDE CRYS ER 20 MEQ PO TBCR: 40.0000 meq | EXTENDED_RELEASE_TABLET | Freq: Once | ORAL | Status: DC

## 2016-06-18 MED ORDER — POTASSIUM CHLORIDE CRYS ER 20 MEQ PO TBCR
40.0000 meq | EXTENDED_RELEASE_TABLET | Freq: Two times a day (BID) | ORAL | Status: AC
  Administered 2016-06-18 – 2016-06-19 (×3): 40 meq via ORAL
  Filled 2016-06-18 (×4): qty 2

## 2016-06-18 MED ORDER — FUROSEMIDE 10 MG/ML IJ SOLN
40.0000 mg | Freq: Once | INTRAMUSCULAR | Status: AC
  Administered 2016-06-18: 40 mg via INTRAVENOUS
  Filled 2016-06-18: qty 4

## 2016-06-18 MED ORDER — ALBUTEROL SULFATE (2.5 MG/3ML) 0.083% IN NEBU
2.5000 mg | INHALATION_SOLUTION | RESPIRATORY_TRACT | Status: DC
  Administered 2016-06-18 – 2016-06-20 (×11): 2.5 mg via RESPIRATORY_TRACT
  Filled 2016-06-18 (×12): qty 3

## 2016-06-18 MED ORDER — IPRATROPIUM-ALBUTEROL 0.5-2.5 (3) MG/3ML IN SOLN: 3.0000 mL | RESPIRATORY_TRACT | Status: DC

## 2016-06-18 MED ORDER — BUDESONIDE 0.25 MG/2ML IN SUSP
0.2500 mg | Freq: Two times a day (BID) | RESPIRATORY_TRACT | Status: DC
  Administered 2016-06-18 – 2016-06-24 (×13): 0.25 mg via RESPIRATORY_TRACT
  Filled 2016-06-18 (×14): qty 2

## 2016-06-18 MED ORDER — METHYLPREDNISOLONE SODIUM SUCC 125 MG IJ SOLR
60.0000 mg | Freq: Four times a day (QID) | INTRAMUSCULAR | Status: DC
  Administered 2016-06-18 – 2016-06-21 (×12): 60 mg via INTRAVENOUS
  Filled 2016-06-18 (×12): qty 2

## 2016-06-18 MED ORDER — POTASSIUM CHLORIDE IN NACL 40-0.9 MEQ/L-% IV SOLN
INTRAVENOUS | Status: DC
  Administered 2016-06-18: 75 mL/h via INTRAVENOUS
  Filled 2016-06-18: qty 1000

## 2016-06-18 MED ORDER — VANCOMYCIN HCL IN DEXTROSE 1-5 GM/200ML-% IV SOLN
1000.0000 mg | Freq: Three times a day (TID) | INTRAVENOUS | Status: DC
  Administered 2016-06-18 – 2016-06-20 (×7): 1000 mg via INTRAVENOUS
  Filled 2016-06-18 (×7): qty 200

## 2016-06-18 MED ORDER — IPRATROPIUM-ALBUTEROL 0.5-2.5 (3) MG/3ML IN SOLN
RESPIRATORY_TRACT | Status: AC
  Administered 2016-06-18: 08:00:00
  Filled 2016-06-18: qty 3

## 2016-06-18 NOTE — Progress Notes (Signed)
Pt placed back on BiPAP for respiratory distress (incereased WOB and decreasing SpO2).  RT will continue to monitor as needed.

## 2016-06-18 NOTE — Progress Notes (Signed)
PROGRESS NOTE    Jeffrey Ochoa  [id number]  DOB: 31-Aug-1955  DOA: 06/16/2016 PCP: Penni Homans, MD Outpatient Specialists:  Hospital course: 60 y/o male heavy chronic smoker presented from ALF with progressive SOB, cough, fever and found in ED to have multifocal pneumonia, requiring bipap therapy, also has hyponatremia.  Pt has paranoid schizophrenia.    Assessment & Plan:    Acute hypoxic Respiratory Failure - Pt initially improved but now decompensating again, can't get off bipap, ordered stat neb, ABG, chest xray, scheduled nebs, pulmicort nebs.  I had discussion with patient and he reports that he does not want to be intubated and says that he is DNR.  Continue bipap therapy.     Multifocal Pneumonia - given that he is from a facility and clinically decompensating, will switch antibiotics to zosyn/vanc.     Acute Exacerbation of COPD - continue steroid therapy, scheduled nebs   Hypovolemic Hyponatremia - improved with IVF hydration with NS.    Leukocytosis - likely exacerbated by steroids and pneumonia.    Hypokalemia - Added KCl to IVFs.    Paranoid Schizophrenia - continue home medications, currently controlled, at baseline.   Essential Hypertension - resume home medications, follow.    Elevated liver Enzymes and Hyperbilirubinemia - improved with IVF hydration.    DVT prophylaxis: enoxaparin Code Status: DNR / DNI  Disposition Plan: TBD  Subjective: Pt having more respiratory distress today and not able to come off the bipap  Objective: Vitals:   06/18/16 0400 06/18/16 0500 06/18/16 0600 06/18/16 0700  BP: 118/67 117/61 (!) 145/72 128/78  Pulse:      Resp: (!) 32 (!) 36 (!) 26 (!) 23  Temp:      TempSrc:      SpO2: 95% 96% 93% 98%  Weight:      Height:        Intake/Output Summary (Last 24 hours) at 06/18/16 0747 Last data filed at 06/18/16 0700  Gross per 24 hour  Intake          3118.75 ml  Output             4150 ml  Net         -1031.25  ml   Filed Weights   06/16/16 0800  Weight: 78.7 kg (173 lb 8 oz)    Exam:  General exam: thin male, alert, in no distress.  Respiratory system: on bipap, not moving air well, tight bilateral.  Cardiovascular system: S1 & S2 heard, RRR. No JVD, murmurs, gallops, clicks or pedal edema. Gastrointestinal system: Abdomen is nondistended, soft and nontender. Normal bowel sounds heard. Central nervous system: Alert and oriented. No focal neurological deficits. Extremities: no CCE.  Data Reviewed: Basic Metabolic Panel:  Recent Labs Lab 06/16/16 0456 06/17/16 0332  NA 121* 137  K 4.3 3.4*  CL 88* 106  CO2 23 25  GLUCOSE 125* 155*  BUN 14 13  CREATININE 0.76 0.60*  CALCIUM 8.1* 7.7*   Liver Function Tests:  Recent Labs Lab 06/16/16 0456 06/17/16 0332  AST 81* 43*  ALT 36 33  ALKPHOS 77 56  BILITOT 2.0* 0.9  PROT 5.9* 4.9*  ALBUMIN 3.0* 2.3*   No results for input(s): LIPASE, AMYLASE in the last 168 hours. No results for input(s): AMMONIA in the last 168 hours. CBC:  Recent Labs Lab 06/16/16 0345 06/17/16 0332  WBC 26.6* 25.5*  NEUTROABS 24.5*  --   HGB 12.8* 10.3*  HCT 34.9* 29.7*  MCV 87.7  91.4  PLT 385 305   Cardiac Enzymes: No results for input(s): CKTOTAL, CKMB, CKMBINDEX, TROPONINI in the last 168 hours. CBG (last 3)  No results for input(s): GLUCAP in the last 72 hours. Recent Results (from the past 240 hour(s))  Blood Culture (routine x 2)     Status: None (Preliminary result)   Collection Time: 06/16/16  4:32 AM  Result Value Ref Range Status   Specimen Description BLOOD LEFT FOREARM  Final   Special Requests BOTTLES DRAWN AEROBIC AND ANAEROBIC 5ML  Final   Culture   Final    NO GROWTH 1 DAY Performed at [facility]    Report Status PENDING  Incomplete  Blood Culture (routine x 2)     Status: None (Preliminary result)   Collection Time: 06/16/16  4:32 AM  Result Value Ref Range Status   Specimen Description BLOOD LEFT FOREARM LOWER   Final   Special Requests BOTTLES DRAWN AEROBIC AND ANAEROBIC 5ML  Final   Culture   Final    NO GROWTH 1 DAY Performed at [facility]    Report Status PENDING  Incomplete  Urine culture     Status: None   Collection Time: 06/16/16  5:48 AM  Result Value Ref Range Status   Specimen Description URINE, CLEAN CATCH  Final   Special Requests NONE  Final   Culture NO GROWTH Performed at [facility]   Final   Report Status 06/17/2016 FINAL  Final  MRSA PCR Screening     Status: None   Collection Time: 06/16/16  8:01 AM  Result Value Ref Range Status   MRSA by PCR NEGATIVE NEGATIVE Final    Comment:        The GeneXpert MRSA Assay (FDA approved for NASAL specimens only), is one component of a comprehensive MRSA colonization surveillance program. It is not intended to diagnose MRSA infection nor to guide or monitor treatment for MRSA infections.      Studies: No results found.   Scheduled Meds: . albuterol  2.5 mg Nebulization Q4H  . atenolol  25 mg Oral BID  . benztropine  2 mg Oral Daily  . budesonide (PULMICORT) nebulizer solution  0.25 mg Nebulization BID  . busPIRone  10 mg Oral TID  . clonazePAM  1 mg Oral BID  . enoxaparin (LOVENOX) injection  40 mg Subcutaneous Q24H  . enoxaparin (LOVENOX) injection  40 mg Subcutaneous Daily  . [START ON 06/23/2016] fluPHENAZine decanoate  75 mg Intramuscular Q14 Days  . ipratropium-albuterol      . levofloxacin (LEVAQUIN) IV  750 mg Intravenous Q24H  . methylPREDNISolone (SOLU-MEDROL) injection  80 mg Intravenous Once  . nicotine  21 mg Transdermal Daily  . predniSONE  40 mg Oral QAC breakfast  . sertraline  50 mg Oral Daily  . sodium chloride flush  3 mL Intravenous Q12H  . tiotropium  18 mcg Inhalation Daily   Continuous Infusions:   Active Problems:   Schizophrenia, paranoid ([place])   Tobacco use disorder   HTN (hypertension)   Respiratory failure with hypoxia (HCC)   Multifocal pneumonia    Leukocytosis   COPD exacerbation [facility])  Critical Care Time spent: 35 mins  Irwin Brakeman, MD, FAAFP [facility] Pager [phone]  If 7PM-7AM, please contact night-coverage [email or web address] Password [email or web address] 06/18/2016, 7:47 AM    LOS: 2 days

## 2016-06-18 NOTE — Progress Notes (Signed)
Pharmacy Antibiotic Note  Jeffrey Ochoa is a 60 y.o. male admitted on 06/16/2016 with pneumonia.  Pharmacy has been consulted for Vanc/Zosyn dosing.  Plan: Vancomycin 1g IV every 8 hours.  Goal trough 15-20 mcg/mL. Zosyn 3.375g IV q8h (4 hour infusion).  Height: 6\' 7"  (200.7 cm) Weight: 173 lb 8 oz (78.7 kg) IBW/kg (Calculated) : 93.7  Temp (24hrs), Avg:98 F (36.7 C), Min:97.5 F (36.4 C), Max:98.2 F (36.8 C)   Recent Labs Lab 06/16/16 0345 06/16/16 0438 06/16/16 0456 06/16/16 0747 06/17/16 0332  WBC 26.6*  --   --   --  25.5*  CREATININE  --   --  0.76  --  0.60*  LATICACIDVEN  --  1.88  --  1.28  --     Estimated Creatinine Clearance: 107.9 mL/min (by C-G formula based on SCr of 0.6 mg/dL (L)).    Allergies  Allergen Reactions  . Risperidone And Related Other (See Comments)    Makes him Manic      Thank you for allowing pharmacy to be a part of this patient's care.   Adrian Saran, PharmD, BCPS Pager [phone] 06/18/2016 8:02 AM

## 2016-06-19 ENCOUNTER — Inpatient Hospital Stay (HOSPITAL_COMMUNITY): Payer: Medicare Other

## 2016-06-19 DIAGNOSIS — I10 Essential (primary) hypertension: Secondary | ICD-10-CM

## 2016-06-19 DIAGNOSIS — F17200 Nicotine dependence, unspecified, uncomplicated: Secondary | ICD-10-CM

## 2016-06-19 DIAGNOSIS — J441 Chronic obstructive pulmonary disease with (acute) exacerbation: Secondary | ICD-10-CM

## 2016-06-19 DIAGNOSIS — F200 Paranoid schizophrenia: Secondary | ICD-10-CM

## 2016-06-19 DIAGNOSIS — D72829 Elevated white blood cell count, unspecified: Secondary | ICD-10-CM

## 2016-06-19 LAB — CBC WITH DIFFERENTIAL/PLATELET
BASOS ABS: 0 10*3/uL (ref 0.0–0.1)
BASOS PCT: 0 %
EOS ABS: 0 10*3/uL (ref 0.0–0.7)
EOS PCT: 0 %
HEMATOCRIT: 30.2 % — AB (ref 39.0–52.0)
Hemoglobin: 10.2 g/dL — ABNORMAL LOW (ref 13.0–17.0)
Lymphocytes Relative: 3 %
Lymphs Abs: 0.6 10*3/uL — ABNORMAL LOW (ref 0.7–4.0)
MCH: 31 pg (ref 26.0–34.0)
MCHC: 33.8 g/dL (ref 30.0–36.0)
MCV: 91.8 fL (ref 78.0–100.0)
MONO ABS: 0.5 10*3/uL (ref 0.1–1.0)
MONOS PCT: 2 %
NEUTROS ABS: 21.5 10*3/uL — AB (ref 1.7–7.7)
Neutrophils Relative %: 95 %
PLATELETS: 162 10*3/uL (ref 150–400)
RBC: 3.29 MIL/uL — ABNORMAL LOW (ref 4.22–5.81)
RDW: 13.5 % (ref 11.5–15.5)
WBC: 22.6 10*3/uL — ABNORMAL HIGH (ref 4.0–10.5)

## 2016-06-19 LAB — COMPREHENSIVE METABOLIC PANEL
ALBUMIN: 2.4 g/dL — AB (ref 3.5–5.0)
ALT: 27 U/L (ref 17–63)
ANION GAP: 6 (ref 5–15)
AST: 27 U/L (ref 15–41)
Alkaline Phosphatase: 72 U/L (ref 38–126)
BILIRUBIN TOTAL: 0.7 mg/dL (ref 0.3–1.2)
BUN: 20 mg/dL (ref 6–20)
CHLORIDE: 98 mmol/L — AB (ref 101–111)
CO2: 32 mmol/L (ref 22–32)
Calcium: 7.9 mg/dL — ABNORMAL LOW (ref 8.9–10.3)
Creatinine, Ser: 0.83 mg/dL (ref 0.61–1.24)
GFR calc Af Amer: >60 mL/min (ref >60)
GFR calc non Af Amer: >60 mL/min (ref >60)
GLUCOSE: 161 mg/dL — AB (ref 65–99)
POTASSIUM: 3.4 mmol/L — AB (ref 3.5–5.1)
SODIUM: 136 mmol/L (ref 135–145)
TOTAL PROTEIN: 5.3 g/dL — AB (ref 6.5–8.1)

## 2016-06-19 LAB — STREP PNEUMONIAE URINARY ANTIGEN: Strep Pneumo Urinary Antigen: NEGATIVE

## 2016-06-19 MED ORDER — BISACODYL 10 MG RE SUPP: 10.0000 mg | Freq: Every day | RECTAL | Status: DC | PRN

## 2016-06-19 MED ORDER — AZITHROMYCIN 250 MG PO TABS
500.0000 mg | ORAL_TABLET | Freq: Every day | ORAL | Status: AC
  Administered 2016-06-19 – 2016-06-21 (×3): 500 mg via ORAL
  Filled 2016-06-19 (×3): qty 2

## 2016-06-19 MED ORDER — SENNA 8.6 MG PO TABS
2.0000 | ORAL_TABLET | Freq: Every day | ORAL | Status: DC
  Administered 2016-06-19 – 2016-06-20 (×2): 17.2 mg via ORAL
  Filled 2016-06-19 (×4): qty 2

## 2016-06-19 MED ORDER — ORAL CARE MOUTH RINSE
15.0000 mL | Freq: Two times a day (BID) | OROMUCOSAL | Status: DC
  Administered 2016-06-20 – 2016-06-24 (×6): 15 mL via OROMUCOSAL

## 2016-06-19 MED ORDER — POLYETHYLENE GLYCOL 3350 17 G PO PACK
17.0000 g | PACK | Freq: Every day | ORAL | Status: DC
  Administered 2016-06-20 – 2016-06-21 (×2): 17 g via ORAL
  Filled 2016-06-19 (×6): qty 1

## 2016-06-19 MED ORDER — FUROSEMIDE 10 MG/ML IJ SOLN
40.0000 mg | Freq: Two times a day (BID) | INTRAMUSCULAR | Status: DC
  Administered 2016-06-19 (×2): 40 mg via INTRAVENOUS
  Filled 2016-06-19 (×2): qty 4

## 2016-06-19 NOTE — Progress Notes (Signed)
Date:  June 19, 2016 Chart reviewed for concurrent status and case management needs. Will continue to follow patient progress.  hfnc and bipap , iv solu-medrol and nebs. Discharge Planning: following for needs Expected discharge date: [id number] Karol Skarzynski, BSN, [place], [place]

## 2016-06-19 NOTE — Progress Notes (Addendum)
PROGRESS NOTE  Jeffrey Ochoa  [id number] DOB: [birth year] DOA: 06/16/2016 PCP: Penni Homans, MD  Brief Narrative:  60 y/o male heavy chronic smoker presented from ALF with progressive SOB, cough, fever and found in ED to have multifocal pneumonia, requiring bipap therapy, also has hyponatremia.  Pt has paranoid schizophrenia.    Assessment & Plan:   Active Problems:   Schizophrenia, paranoid ([place])   Tobacco use disorder   HTN (hypertension)   Respiratory failure with hypoxia (HCC)   Multifocal pneumonia   Leukocytosis   COPD exacerbation (HCC)  Acute respiratory failure with hypoxia secondary to multifocal pneumonia and acute COPD exacerbation -  Continue when necessary BiPAP with intermittent high flow nasal cannula -  Continue vancomycin and Zosyn -  Add azithromycin for atypical coverage -  Broad differential to include IPF and PCP pneumonia if not continuing to improve -  Check HIV -  Legionella and s. pneumo urine antigens -  MRSA PCR was negative -  Continue steroids and duonebs -  Consider pulmonology consultation if not improving -  Trial of lasix 40mg  IV BID -  Strict I/O -  Daily weights  Hypovolemic hyponatremia, resolved with IVF  Leukocytosis due to steroids and pneumonia, continue to trend  Hypokalemia, continue oral potassium supplementation Paranoid schizophrenia, currently stable, continue home medications Essential hypertension, BP stable, continue home medications Elevated LFTs, resolved with IVF Normocytic anemia, hemoglobin approximately stable near 10.2 -  Repeat hgb in AM   DVT prophylaxis:  lovenox Code Status:  DNR/DNI Family Communication:  Patient alone Disposition Plan:  Pending improvement in O2 requirements   Consultants:   none  Procedures:  none  Antimicrobials:  Anti-infectives    Start     Dose/Rate Route Frequency Ordered Stop   06/19/16 0800  azithromycin (ZITHROMAX) tablet 500 mg     500 mg Oral Daily 06/19/16  0714 06/22/16 0959   06/18/16 0900  vancomycin (VANCOCIN) IVPB 1000 mg/200 mL premix     1,000 mg 200 mL/hr over 60 Minutes Intravenous Every 8 hours 06/18/16 0803     06/18/16 0900  piperacillin-tazobactam (ZOSYN) IVPB 3.375 g     3.375 g 12.5 mL/hr over 240 Minutes Intravenous Every 8 hours 06/18/16 0803     06/17/16 0600  levofloxacin (LEVAQUIN) IVPB 750 mg  Status:  Discontinued     750 mg 100 mL/hr over 90 Minutes Intravenous Every 24 hours 06/16/16 1114 06/18/16 0752   06/16/16 1115  levofloxacin (LEVAQUIN) IVPB 750 mg  Status:  Discontinued     750 mg 100 mL/hr over 90 Minutes Intravenous Every 24 hours 06/16/16 1114 06/16/16 1116   06/16/16 0430  levofloxacin (LEVAQUIN) IVPB 750 mg     750 mg 100 mL/hr over 90 Minutes Intravenous  Once 06/16/16 0422 06/16/16 0617         Subjective:  Persistent cough.  Wheezing.  Denies lower extremity edema or abdominal swelling.  Glad to have the bipap removed.    Objective: Vitals:   06/19/16 0700 06/19/16 0800 06/19/16 0926 06/19/16 1200  BP: 129/65 140/86  117/84  Pulse:      Resp: (!) 31 20  (!) 23  Temp:  97.5 F (36.4 C)    TempSrc:  Oral    SpO2: 98% 92% 93% 97%  Weight:      Height:        Intake/Output Summary (Last 24 hours) at 06/19/16 1515 Last data filed at 06/19/16 1305  Gross per 24 hour  Intake              950 ml  Output             3700 ml  Net            -2750 ml   Filed Weights   06/16/16 0800  Weight: 78.7 kg (173 lb 8 oz)    Examination:  General exam:  Adult male, thin, mild tachypnea at rest while on high flow [place].  HEENT:  NCAT, MMM Respiratory system:  Wheezes and rales of left lung fields, right is somewhat diminished.  No rhonchi Cardiovascular system: Regular rate and rhythm, normal S1/S2. No murmurs, rubs, gallops or clicks.  Warm extremities Gastrointestinal system: Normal active bowel sounds, soft, nondistended, nontender. MSK:  Normal tone and bulk, no lower extremity edema Neuro:   Grossly intact    Data Reviewed: I have personally reviewed following labs and imaging studies  CBC:  Recent Labs Lab 06/16/16 0345 06/17/16 0332 06/18/16 0740 06/19/16 0303  WBC 26.6* 25.5* 26.9* 22.6*  NEUTROABS 24.5*  --   --  21.5*  HGB 12.8* 10.3* 11.0* 10.2*  HCT 34.9* 29.7* 33.5* 30.2*  MCV 87.7 91.4 94.6 91.8  PLT 385 305 225 0000000   Basic Metabolic Panel:  Recent Labs Lab 06/16/16 0456 06/17/16 0332 06/18/16 0740 06/19/16 0303  NA 121* 137 137 136  K 4.3 3.4* 3.6 3.4*  CL 88* 106 102 98*  CO2 23 25 27  32  GLUCOSE 125* 155* 126* 161*  BUN 14 13 11 20   CREATININE 0.76 0.60* 0.58* 0.83  CALCIUM 8.1* 7.7* 7.8* 7.9*   GFR: Estimated Creatinine Clearance: 104 mL/min (by C-G formula based on SCr of 0.83 mg/dL). Liver Function Tests:  Recent Labs Lab 06/16/16 0456 06/17/16 0332 06/19/16 0303  AST 81* 43* 27  ALT 36 33 27  ALKPHOS 77 56 72  BILITOT 2.0* 0.9 0.7  PROT 5.9* 4.9* 5.3*  ALBUMIN 3.0* 2.3* 2.4*   No results for input(s): LIPASE, AMYLASE in the last 168 hours. No results for input(s): AMMONIA in the last 168 hours. Coagulation Profile:  Recent Labs Lab 06/17/16 0332  INR 1.32   Cardiac Enzymes: No results for input(s): CKTOTAL, CKMB, CKMBINDEX, TROPONINI in the last 168 hours. BNP (last 3 results) No results for input(s): PROBNP in the last 8760 hours. HbA1C: No results for input(s): HGBA1C in the last 72 hours. CBG: No results for input(s): GLUCAP in the last 168 hours. Lipid Profile: No results for input(s): CHOL, HDL, LDLCALC, TRIG, CHOLHDL, LDLDIRECT in the last 72 hours. Thyroid Function Tests: No results for input(s): TSH, T4TOTAL, FREET4, T3FREE, THYROIDAB in the last 72 hours. Anemia Panel: No results for input(s): VITAMINB12, FOLATE, FERRITIN, TIBC, IRON, RETICCTPCT in the last 72 hours. Urine analysis:    Component Value Date/Time   COLORURINE YELLOW 06/16/2016 0548   APPEARANCEUR CLEAR 06/16/2016 0548   LABSPEC 1.009  06/16/2016 0548   PHURINE 6.5 06/16/2016 0548   GLUCOSEU NEGATIVE 06/16/2016 0548   HGBUR LARGE (A) 06/16/2016 0548   BILIRUBINUR NEGATIVE 06/16/2016 0548   KETONESUR NEGATIVE 06/16/2016 0548   PROTEINUR NEGATIVE 06/16/2016 0548   NITRITE NEGATIVE 06/16/2016 0548   LEUKOCYTESUR NEGATIVE 06/16/2016 0548   Sepsis Labs: @LABRCNTIP (procalcitonin:4,lacticidven:4)  ) Recent Results (from the past 240 hour(s))  Blood Culture (routine x 2)     Status: None (Preliminary result)   Collection Time: 06/16/16  4:32 AM  Result Value Ref Range Status   Specimen Description BLOOD  LEFT FOREARM  Final   Special Requests BOTTLES DRAWN AEROBIC AND ANAEROBIC 5ML  Final   Culture   Final    NO GROWTH 2 DAYS Performed at [facility]    Report Status PENDING  Incomplete  Blood Culture (routine x 2)     Status: None (Preliminary result)   Collection Time: 06/16/16  4:32 AM  Result Value Ref Range Status   Specimen Description BLOOD LEFT FOREARM LOWER  Final   Special Requests BOTTLES DRAWN AEROBIC AND ANAEROBIC 5ML  Final   Culture   Final    NO GROWTH 2 DAYS Performed at [facility]    Report Status PENDING  Incomplete  Urine culture     Status: None   Collection Time: 06/16/16  5:48 AM  Result Value Ref Range Status   Specimen Description URINE, CLEAN CATCH  Final   Special Requests NONE  Final   Culture NO GROWTH Performed at [facility]   Final   Report Status 06/17/2016 FINAL  Final  MRSA PCR Screening     Status: None   Collection Time: 06/16/16  8:01 AM  Result Value Ref Range Status   MRSA by PCR NEGATIVE NEGATIVE Final    Comment:        The GeneXpert MRSA Assay (FDA approved for NASAL specimens only), is one component of a comprehensive MRSA colonization surveillance program. It is not intended to diagnose MRSA infection nor to guide or monitor treatment for MRSA infections.       Radiology Studies: Dg Chest Port 1 View  Result Date:  06/19/2016 CLINICAL DATA:  Pneumonia. EXAM: PORTABLE CHEST 1 VIEW COMPARISON:  06/18/2016 FINDINGS: The cardiomediastinal silhouette is within normal limits. Bilateral lung opacities, greatest in the left lower lobe, have not significantly changed from the prior study. No sizable pleural effusion or pneumothorax is identified. No acute osseous abnormality is seen. IMPRESSION: Unchanged bilateral lung opacities concerning for multifocal pneumonia. Electronically Signed   By: Logan Bores M.D.   On: 06/19/2016 07:28   Dg Chest Port 1 View  Result Date: 06/18/2016 CLINICAL DATA:  COPD with exacerbation EXAM: PORTABLE CHEST 1 VIEW COMPARISON:  06/16/2016 FINDINGS: Normal cardiac silhouette. There is fine airspace disease in the RIGHT upper lobe and LEFT lower lobe similar to comparison exam. No pneumothorax no pleural fluid evident. IMPRESSION: Bilateral airspace disease suggesting pulmonary edema versus multifocal pneumonia. No significant change. Electronically Signed   By: Suzy Bouchard M.D.   On: 06/18/2016 08:28     Scheduled Meds: . albuterol  2.5 mg Nebulization Q4H  . atenolol  25 mg Oral BID  . azithromycin  500 mg Oral Daily  . benztropine  2 mg Oral Daily  . budesonide (PULMICORT) nebulizer solution  0.25 mg Nebulization BID  . busPIRone  10 mg Oral TID  . clonazePAM  1 mg Oral BID  . enoxaparin (LOVENOX) injection  40 mg Subcutaneous Q24H  . [START ON 06/23/2016] fluPHENAZine decanoate  75 mg Intramuscular Q14 Days  . furosemide  40 mg Intravenous BID  . methylPREDNISolone (SOLU-MEDROL) injection  60 mg Intravenous Q6H  . nicotine  21 mg Transdermal Daily  . piperacillin-tazobactam (ZOSYN)  IV  3.375 g Intravenous Q8H  . potassium chloride  40 mEq Oral BID  . sertraline  50 mg Oral Daily  . sodium chloride flush  3 mL Intravenous Q12H  . tiotropium  18 mcg Inhalation Daily  . vancomycin  1,000 mg Intravenous Q8H   Continuous  Infusions:   LOS: 3 days    Time spent: 30  min    Janece Canterbury, MD [facility] Pager [phone]  If 7PM-7AM, please contact night-coverage [email or web address] Password [email or web address] 06/19/2016, 3:15 PM

## 2016-06-20 LAB — BASIC METABOLIC PANEL
ANION GAP: 9 (ref 5–15)
BUN: 22 mg/dL — AB (ref 6–20)
CALCIUM: 8.1 mg/dL — AB (ref 8.9–10.3)
CO2: 34 mmol/L — AB (ref 22–32)
CREATININE: 1 mg/dL (ref 0.61–1.24)
Chloride: 91 mmol/L — ABNORMAL LOW (ref 101–111)
GFR calc Af Amer: >60 mL/min (ref >60)
GFR calc non Af Amer: >60 mL/min (ref >60)
GLUCOSE: 201 mg/dL — AB (ref 65–99)
Potassium: 3.8 mmol/L (ref 3.5–5.1)
Sodium: 134 mmol/L — ABNORMAL LOW (ref 135–145)

## 2016-06-20 LAB — CBC
HCT: 35.4 % — ABNORMAL LOW (ref 39.0–52.0)
HEMOGLOBIN: 11.7 g/dL — AB (ref 13.0–17.0)
MCH: 31.5 pg (ref 26.0–34.0)
MCHC: 33.1 g/dL (ref 30.0–36.0)
MCV: 95.2 fL (ref 78.0–100.0)
Platelets: 178 10*3/uL (ref 150–400)
RBC: 3.72 MIL/uL — ABNORMAL LOW (ref 4.22–5.81)
RDW: 14 % (ref 11.5–15.5)
WBC: 26.6 10*3/uL — ABNORMAL HIGH (ref 4.0–10.5)

## 2016-06-20 LAB — HIV ANTIBODY (ROUTINE TESTING W REFLEX): HIV Screen 4th Generation wRfx: NONREACTIVE

## 2016-06-20 LAB — VANCOMYCIN, TROUGH: VANCOMYCIN TR: 12 ug/mL — AB (ref 15–20)

## 2016-06-20 LAB — LEGIONELLA PNEUMOPHILA SEROGP 1 UR AG: L. PNEUMOPHILA SEROGP 1 UR AG: NEGATIVE

## 2016-06-20 MED ORDER — ALBUTEROL SULFATE (2.5 MG/3ML) 0.083% IN NEBU
2.5000 mg | INHALATION_SOLUTION | Freq: Four times a day (QID) | RESPIRATORY_TRACT | Status: DC
  Administered 2016-06-20 – 2016-06-21 (×4): 2.5 mg via RESPIRATORY_TRACT
  Filled 2016-06-20 (×4): qty 3

## 2016-06-20 NOTE — Progress Notes (Signed)
CSW assisting with d/c planning. Met with pt's sister today to offer support. Sister reports that pt thinks he is going to [facility] following hospital d/c. Informed sister that pt will return to [facility] provided he is still appropriate for ALF and pt is ready for d/c prior to 07/04/16. [place] closes on the 30th. Pt has an ALF bed at [facility] ( [facility]F ), [facility] sister facility in [place]. If pt is appropriate for ALF but still hospitalized on 07/04/16 CSW will assist with d/c planning to [facility]. If pt requires SNF placement at d/c CSW will initiate SNF search prior to d/c. Pt's sister requested that we keep her updated and discuss facility placement with her prior to updating pt.   Werner Lean LCSW [phone]

## 2016-06-20 NOTE — Progress Notes (Addendum)
PROGRESS NOTE  Jeffrey Ochoa  [id number] DOB: Dec 10, 1955 DOA: 06/16/2016 PCP: Penni Homans, MD  Brief Narrative:  60 y/o male heavy chronic smoker presented from ALF with progressive SOB, cough, fever and found in ED to have multifocal pneumonia, requiring bipap therapy, also has hyponatremia.  Pt has paranoid schizophrenia.    Assessment & Plan:   Active Problems:   Schizophrenia, paranoid ([place])   Tobacco use disorder   HTN (hypertension)   Respiratory failure with hypoxia (HCC)   Multifocal pneumonia   Leukocytosis   COPD exacerbation (HCC)  Acute respiratory failure with hypoxia secondary to multifocal pneumonia and acute COPD exacerbation -  Continue when necessary BiPAP with intermittent high flow nasal cannula -  Continue Zosyn -  Discontinue vancomycin (MRSA PCR neg, BCx NGTD) -  Continue azithromycin for atypical coverage -  Broad differential to include IPF and PCP pneumonia if not continuing to improve -  HIV neg -  Legionella neg and s. pneumo neg -  Continue steroids and duonebs -  Consider pulmonology consultation if not improving -  D/c lasix -  Neg 3.3L yesterday, but BUN and creatinine rising -  IS  Hypovolemic hyponatremia, resolved with IVF  Leukocytosis due to steroids and pneumonia, continue to trend  Hypokalemia, continue oral potassium supplementation Paranoid schizophrenia, currently stable, continue home medications.  Needs prolixin infusion soon Essential hypertension, BP stable, continue home medications Elevated LFTs, resolved with IVF Normocytic anemia, hemoglobin approximately stable near 10.2 -  Repeat hgb in AM   DVT prophylaxis:  lovenox Code Status:  DNR/DNI Family Communication:  Patient alone Disposition Plan:  Pending improvement in O2 requirements   Consultants:   none  Procedures:  none  Antimicrobials:  Anti-infectives    Start     Dose/Rate Route Frequency Ordered Stop   06/19/16 0800  azithromycin  (ZITHROMAX) tablet 500 mg     500 mg Oral Daily 06/19/16 0714 06/22/16 0959   06/18/16 0900  vancomycin (VANCOCIN) IVPB 1000 mg/200 mL premix  Status:  Discontinued     1,000 mg 200 mL/hr over 60 Minutes Intravenous Every 8 hours 06/18/16 0803 06/20/16 0815   06/18/16 0900  piperacillin-tazobactam (ZOSYN) IVPB 3.375 g     3.375 g 12.5 mL/hr over 240 Minutes Intravenous Every 8 hours 06/18/16 0803     06/17/16 0600  levofloxacin (LEVAQUIN) IVPB 750 mg  Status:  Discontinued     750 mg 100 mL/hr over 90 Minutes Intravenous Every 24 hours 06/16/16 1114 06/18/16 0752   06/16/16 1115  levofloxacin (LEVAQUIN) IVPB 750 mg  Status:  Discontinued     750 mg 100 mL/hr over 90 Minutes Intravenous Every 24 hours 06/16/16 1114 06/16/16 1116   06/16/16 0430  levofloxacin (LEVAQUIN) IVPB 750 mg     750 mg 100 mL/hr over 90 Minutes Intravenous  Once 06/16/16 0422 06/16/16 0617         Subjective:  Feels better.  Persistent cough.  Feels like he can breath more deeply today.  Voided frequently yesterday.  Still no BM.   Objective: Vitals:   06/20/16 0748 06/20/16 0800 06/20/16 0814 06/20/16 1232  BP: 133/72 134/78    Pulse:      Resp: (!) 27 19    Temp:  98.4 F (36.9 C)  98.3 F (36.8 C)  TempSrc:  Oral  Oral  SpO2: 95% (!) 88% 93%   Weight:      Height:        Intake/Output Summary (Last 24  hours) at 06/20/16 1243 Last data filed at 06/20/16 0800  Gross per 24 hour  Intake              920 ml  Output             2825 ml  Net            -1905 ml   Filed Weights   06/16/16 0800 06/20/16 0500  Weight: 78.7 kg (173 lb 8 oz) 73.8 kg (162 lb 11.2 oz)    Examination:  General exam:  Adult male, thin, mild tachypnea at rest while on high flow [place].  HEENT:  NCAT, MMM Respiratory system:  Wheezes and rales of left lung fields, right is somewhat diminished.  No rhonchi Cardiovascular system: Regular rate and rhythm, normal S1/S2. No murmurs, rubs, gallops or clicks.  Warm  extremities Gastrointestinal system: Normal active bowel sounds, soft, nondistended, nontender. MSK:  Normal tone and bulk, no lower extremity edema Neuro:  Grossly intact    Data Reviewed: I have personally reviewed following labs and imaging studies  CBC:  Recent Labs Lab 06/16/16 0345 06/17/16 0332 06/18/16 0740 06/19/16 0303 06/20/16 0318  WBC 26.6* 25.5* 26.9* 22.6* 26.6*  NEUTROABS 24.5*  --   --  21.5*  --   HGB 12.8* 10.3* 11.0* 10.2* 11.7*  HCT 34.9* 29.7* 33.5* 30.2* 35.4*  MCV 87.7 91.4 94.6 91.8 95.2  PLT 385 305 225 162 0000000   Basic Metabolic Panel:  Recent Labs Lab 06/16/16 0456 06/17/16 0332 06/18/16 0740 06/19/16 0303 06/20/16 0318  NA 121* 137 137 136 134*  K 4.3 3.4* 3.6 3.4* 3.8  CL 88* 106 102 98* 91*  CO2 23 25 27  32 34*  GLUCOSE 125* 155* 126* 161* 201*  BUN 14 13 11 20  22*  CREATININE 0.76 0.60* 0.58* 0.83 1.00  CALCIUM 8.1* 7.7* 7.8* 7.9* 8.1*   GFR: Estimated Creatinine Clearance: 81 mL/min (by C-G formula based on SCr of 1 mg/dL). Liver Function Tests:  Recent Labs Lab 06/16/16 0456 06/17/16 0332 06/19/16 0303  AST 81* 43* 27  ALT 36 33 27  ALKPHOS 77 56 72  BILITOT 2.0* 0.9 0.7  PROT 5.9* 4.9* 5.3*  ALBUMIN 3.0* 2.3* 2.4*   No results for input(s): LIPASE, AMYLASE in the last 168 hours. No results for input(s): AMMONIA in the last 168 hours. Coagulation Profile:  Recent Labs Lab 06/17/16 0332  INR 1.32   Cardiac Enzymes: No results for input(s): CKTOTAL, CKMB, CKMBINDEX, TROPONINI in the last 168 hours. BNP (last 3 results) No results for input(s): PROBNP in the last 8760 hours. HbA1C: No results for input(s): HGBA1C in the last 72 hours. CBG: No results for input(s): GLUCAP in the last 168 hours. Lipid Profile: No results for input(s): CHOL, HDL, LDLCALC, TRIG, CHOLHDL, LDLDIRECT in the last 72 hours. Thyroid Function Tests: No results for input(s): TSH, T4TOTAL, FREET4, T3FREE, THYROIDAB in the last 72  hours. Anemia Panel: No results for input(s): VITAMINB12, FOLATE, FERRITIN, TIBC, IRON, RETICCTPCT in the last 72 hours. Urine analysis:    Component Value Date/Time   COLORURINE YELLOW 06/16/2016 [place] 06/16/2016 0548   LABSPEC 1.009 06/16/2016 0548   PHURINE 6.5 06/16/2016 0548   GLUCOSEU NEGATIVE 06/16/2016 0548   HGBUR LARGE (A) 06/16/2016 0548   BILIRUBINUR NEGATIVE 06/16/2016 0548   KETONESUR NEGATIVE 06/16/2016 0548   PROTEINUR NEGATIVE 06/16/2016 0548   NITRITE NEGATIVE 06/16/2016 0548   LEUKOCYTESUR NEGATIVE 06/16/2016 0548   Sepsis  Labs: @LABRCNTIP (procalcitonin:4,lacticidven:4)  ) Recent Results (from the past 240 hour(s))  Blood Culture (routine x 2)     Status: None (Preliminary result)   Collection Time: 06/16/16  4:32 AM  Result Value Ref Range Status   Specimen Description BLOOD LEFT FOREARM  Final   Special Requests BOTTLES DRAWN AEROBIC AND ANAEROBIC 5ML  Final   Culture   Final    NO GROWTH 3 DAYS Performed at [facility]    Report Status PENDING  Incomplete  Blood Culture (routine x 2)     Status: None (Preliminary result)   Collection Time: 06/16/16  4:32 AM  Result Value Ref Range Status   Specimen Description BLOOD LEFT FOREARM LOWER  Final   Special Requests BOTTLES DRAWN AEROBIC AND ANAEROBIC 5ML  Final   Culture   Final    NO GROWTH 3 DAYS Performed at [facility]    Report Status PENDING  Incomplete  Urine culture     Status: None   Collection Time: 06/16/16  5:48 AM  Result Value Ref Range Status   Specimen Description URINE, CLEAN CATCH  Final   Special Requests NONE  Final   Culture NO GROWTH Performed at [facility]   Final   Report Status 06/17/2016 FINAL  Final  MRSA PCR Screening     Status: None   Collection Time: 06/16/16  8:01 AM  Result Value Ref Range Status   MRSA by PCR NEGATIVE NEGATIVE Final    Comment:        The GeneXpert MRSA Assay (FDA approved for NASAL  specimens only), is one component of a comprehensive MRSA colonization surveillance program. It is not intended to diagnose MRSA infection nor to guide or monitor treatment for MRSA infections.       Radiology Studies: Dg Chest Port 1 View  Result Date: 06/19/2016 CLINICAL DATA:  Pneumonia. EXAM: PORTABLE CHEST 1 VIEW COMPARISON:  06/18/2016 FINDINGS: The cardiomediastinal silhouette is within normal limits. Bilateral lung opacities, greatest in the left lower lobe, have not significantly changed from the prior study. No sizable pleural effusion or pneumothorax is identified. No acute osseous abnormality is seen. IMPRESSION: Unchanged bilateral lung opacities concerning for multifocal pneumonia. Electronically Signed   By: Logan Bores M.D.   On: 06/19/2016 07:28     Scheduled Meds: . albuterol  2.5 mg Nebulization QID  . atenolol  25 mg Oral BID  . azithromycin  500 mg Oral Daily  . benztropine  2 mg Oral Daily  . budesonide (PULMICORT) nebulizer solution  0.25 mg Nebulization BID  . busPIRone  10 mg Oral TID  . clonazePAM  1 mg Oral BID  . enoxaparin (LOVENOX) injection  40 mg Subcutaneous Q24H  . [START ON 06/23/2016] fluPHENAZine decanoate  75 mg Intramuscular Q14 Days  . mouth rinse  15 mL Mouth Rinse BID  . methylPREDNISolone (SOLU-MEDROL) injection  60 mg Intravenous Q6H  . nicotine  21 mg Transdermal Daily  . piperacillin-tazobactam (ZOSYN)  IV  3.375 g Intravenous Q8H  . polyethylene glycol  17 g Oral Daily  . senna  2 tablet Oral QHS  . sertraline  50 mg Oral Daily  . sodium chloride flush  3 mL Intravenous Q12H  . tiotropium  18 mcg Inhalation Daily   Continuous Infusions:   LOS: 4 days    Time spent: 30 min    Janece Canterbury, MD [facility] Pager [phone]  If 7PM-7AM, please contact night-coverage [email or web address] Password [facility] 06/20/2016, 12:43  PM

## 2016-06-20 NOTE — Progress Notes (Signed)
Pharmacy Antibiotic Note  Jeffrey Ochoa is a 60 y.o. male admitted on 06/16/2016 with pneumonia.  Pharmacy has been consulted for Vanc/Zosyn dosing.  SCr increased (0.58 > 0.83 > 1), CrCl ~ 81 ml/min Afebrile, WBC remain elevated on solumedrol  Plan:  Continue Zosyn 3.375g IV Q8H infused over 4hrs.  Continue Vancomycin 1g IV q8h.  Re-check Vanc trough as needed  Follow up renal fxn, culture results, and clinical course.  Height: 6\' 7"  (200.7 cm) Weight: 162 lb 11.2 oz (73.8 kg) IBW/kg (Calculated) : 93.7  Temp (24hrs), Avg:98.1 F (36.7 C), Min:97.5 F (36.4 C), Max:98.7 F (37.1 C)   Recent Labs Lab 06/16/16 0345 06/16/16 0438 06/16/16 0456 06/16/16 0747 06/17/16 0332 06/18/16 0740 06/19/16 0303 06/20/16 0318 06/20/16 0658  WBC 26.6*  --   --   --  25.5* 26.9* 22.6* 26.6*  --   CREATININE  --   --  0.76  --  0.60* 0.58* 0.83 1.00  --   LATICACIDVEN  --  1.88  --  1.28  --   --   --   --   --   VANCOTROUGH  --   --   --   --   --   --   --   --  12*    Estimated Creatinine Clearance: 81 mL/min (by C-G formula based on SCr of 1 mg/dL).    Allergies  Allergen Reactions  . Risperidone And Related Other (See Comments)    Makes him Manic    Antimicrobials this admission:  11/12 Levaquin >> 11/14 11/14 Vanc >> 11/14 Zosyn >> 11/15 Azithromycin >> (11/18)  Dose adjustments this admission:  11/16 0700 VT = 12 on 1g q8h, continue current dose with increased SCr  Microbiology results:  11/12 BCx: ngtd 11/12 UCx: NGF 11/12 MRSA PCR: negative   Thank you for allowing pharmacy to be a part of this patient's care.  Gretta Arab PharmD, BCPS Pager [phone] 06/20/2016 8:06 AM

## 2016-06-21 LAB — CULTURE, BLOOD (ROUTINE X 2)
Culture: NO GROWTH
Culture: NO GROWTH

## 2016-06-21 LAB — BASIC METABOLIC PANEL
Anion gap: 8 (ref 5–15)
BUN: 17 mg/dL (ref 6–20)
CO2: 33 mmol/L — ABNORMAL HIGH (ref 22–32)
CREATININE: 0.67 mg/dL (ref 0.61–1.24)
Calcium: 8.1 mg/dL — ABNORMAL LOW (ref 8.9–10.3)
Chloride: 97 mmol/L — ABNORMAL LOW (ref 101–111)
GFR calc Af Amer: >60 mL/min (ref >60)
GLUCOSE: 124 mg/dL — AB (ref 65–99)
POTASSIUM: 4.1 mmol/L (ref 3.5–5.1)
Sodium: 138 mmol/L (ref 135–145)

## 2016-06-21 LAB — CBC
HEMATOCRIT: 36.5 % — AB (ref 39.0–52.0)
Hemoglobin: 12.1 g/dL — ABNORMAL LOW (ref 13.0–17.0)
MCH: 31.6 pg (ref 26.0–34.0)
MCHC: 33.2 g/dL (ref 30.0–36.0)
MCV: 95.3 fL (ref 78.0–100.0)
PLATELETS: 159 10*3/uL (ref 150–400)
RBC: 3.83 MIL/uL — ABNORMAL LOW (ref 4.22–5.81)
RDW: 14 % (ref 11.5–15.5)
WBC: 25.3 10*3/uL — ABNORMAL HIGH (ref 4.0–10.5)

## 2016-06-21 MED ORDER — SERTRALINE HCL 100 MG PO TABS
100.0000 mg | ORAL_TABLET | Freq: Two times a day (BID) | ORAL | Status: DC
Start: 2016-06-21 — End: 2016-06-24
  Administered 2016-06-21 – 2016-06-24 (×7): 100 mg via ORAL
  Filled 2016-06-21 (×7): qty 1

## 2016-06-21 MED ORDER — ALBUTEROL SULFATE (2.5 MG/3ML) 0.083% IN NEBU
2.5000 mg | INHALATION_SOLUTION | Freq: Three times a day (TID) | RESPIRATORY_TRACT | Status: DC
  Administered 2016-06-21 – 2016-06-24 (×9): 2.5 mg via RESPIRATORY_TRACT
  Filled 2016-06-21 (×10): qty 3

## 2016-06-21 MED ORDER — METHYLPREDNISOLONE SODIUM SUCC 125 MG IJ SOLR
60.0000 mg | Freq: Two times a day (BID) | INTRAMUSCULAR | Status: DC
  Administered 2016-06-21 – 2016-06-24 (×6): 60 mg via INTRAVENOUS
  Filled 2016-06-21 (×6): qty 2

## 2016-06-21 NOTE — Evaluation (Signed)
Physical Therapy Evaluation Patient Details Name: Jeffrey Ochoa MRN: [id number] DOB: Jun 10, 1956 Today's Date: 06/21/2016   History of Present Illness  60 y/o male heavy chronic smoker presented from ALF with progressive SOB, cough, fever and found in ED to have multifocal pneumonia, requiring bipap therapy, also has hyponatremia.  Pt has paranoid schizophrenia  Clinical Impression  The patient is mobile but desats with activity into low 80's on 14 liters HFNC. The patient should progress to   Functional level to Return to ALF. Pt admitted with above diagnosis. Pt currently with functional limitations due to the deficits listed below (see PT Problem List). Pt will benefit from skilled PT to increase their independence and safety with mobility to allow discharge to the venue listed below.       Follow Up Recommendations Home health PT (at ALF,)    Equipment Recommendations  None recommended by PT    Recommendations for Other Services       Precautions / Restrictions Precautions Precautions: Fall Precaution Comments: monitor sats      Mobility  Bed Mobility Overal bed mobility: Independent                Transfers Overall transfer level: Needs assistance Equipment used: Rolling walker (2 wheeled) Transfers: Sit to/from Stand Sit to Stand: Min guard         General transfer comment: from bed  Ambulation/Gait Ambulation/Gait assistance: Min guard   Assistive device: Rolling walker (2 wheeled) Gait Pattern/deviations: Step-through pattern     General Gait Details: walked in place x 10 steps, walked 5 ' forward and backward x 2 and side steps x 4. Sats dropped into low 80's on 14 liters HFNC.  Stairs            Wheelchair Mobility    Modified Rankin (Stroke Patients Only)       Balance Overall balance assessment: Needs assistance Sitting-balance support: Feet supported;No upper extremity supported Sitting balance-Leahy Scale: Good     Standing  balance support: During functional activity;Bilateral upper extremity supported Standing balance-Leahy Scale: Fair                               Pertinent Vitals/Pain Pain Assessment: No/denies pain    Home Living Family/patient expects to be discharged to:: Assisted living               Home Equipment: None      Prior Function Level of Independence: Independent         Comments: per patient     Hand Dominance        Extremity/Trunk Assessment   Upper Extremity Assessment: Overall WFL for tasks assessed           Lower Extremity Assessment: Overall WFL for tasks assessed      Cervical / Trunk Assessment: Kyphotic  Communication   Communication: No difficulties  Cognition Arousal/Alertness: Awake/alert Behavior During Therapy: WFL for tasks assessed/performed;Flat affect Overall Cognitive Status: Within Functional Limits for tasks assessed                      General Comments      Exercises     Assessment/Plan    PT Assessment Patient needs continued PT services  PT Problem List Decreased strength;Decreased activity tolerance;Cardiopulmonary status limiting activity;Decreased mobility;Decreased knowledge of precautions;Decreased safety awareness;Decreased knowledge of use of DME  PT Treatment Interventions DME instruction;Gait training;Functional mobility training;Therapeutic activities;Patient/family education    PT Goals (Current goals can be found in the Care Plan section)  Acute Rehab PT Goals Patient Stated Goal: I want to walk PT Goal Formulation: With patient Time For Goal Achievement: 07/05/16 Potential to Achieve Goals: Good    Frequency Min 3X/week   Barriers to discharge        Co-evaluation               End of Session   Activity Tolerance: Patient tolerated treatment well Patient left: in bed;with call bell/phone within reach;with bed alarm set Nurse Communication: Mobility status          Time: [id number] PT Time Calculation (min) (ACUTE ONLY): 20 min   Charges:   PT Evaluation $PT Eval Low Complexity: 1 Procedure     PT G CodesClaretha Cooper 06/21/2016, 3:57 PM Tresa Endo PT [phone]

## 2016-06-21 NOTE — Progress Notes (Signed)
Pharmacy Antibiotic Note  Jeffrey Ochoa is a 60 y.o. male admitted on 06/16/2016 with pneumonia.  Pharmacy has been consulted for Vanc/Zosyn dosing.   D6 total abx, D4 Zosyn  Cx remain negative  Plan:  Continue Zosyn and azithromycin as ordered; per MD note planning for 7 days total abx  SCr back to baseline; Pharmacy will sign off and follow peripherally from here  Height: 6\' 7"  (200.7 cm) Weight: 162 lb 11.2 oz (73.8 kg) IBW/kg (Calculated) : 93.7  Temp (24hrs), Avg:98.3 F (36.8 C), Min:97.6 F (36.4 C), Max:98.9 F (37.2 C)   Recent Labs Lab 06/16/16 0438  06/16/16 0747 06/17/16 0332 06/18/16 0740 06/19/16 0303 06/20/16 0318 06/20/16 0658 06/21/16 0303  WBC  --   --   --  25.5* 26.9* 22.6* 26.6*  --  25.3*  CREATININE  --   < >  --  0.60* 0.58* 0.83 1.00  --  0.67  LATICACIDVEN 1.88  --  1.28  --   --   --   --   --   --   VANCOTROUGH  --   --   --   --   --   --   --  12*  --   < > = values in this interval not displayed.  Estimated Creatinine Clearance: 101.2 mL/min (by C-G formula based on SCr of 0.67 mg/dL).    Allergies  Allergen Reactions  . Risperidone And Related Other (See Comments)    Makes him Manic    Antimicrobials this admission:  11/12 Levaquin >> 11/14 11/14 Vanc >> 11/16 11/14 Zosyn >> (11/18) 11/15 Azithromycin >> (11/18)  Dose adjustments this admission:  11/16 0700 VT = 12 on 1g q8h, continue current dose with increased SCr  Microbiology results:  11/12 BCx: ngtd 11/12 UCx: NGF 11/12 MRSA PCR: negative   Thank you for allowing pharmacy to be a part of this patient's care.  Reuel Boom, PharmD, BCPS Pager: [phone] 06/21/2016, 1:10 PM

## 2016-06-21 NOTE — Progress Notes (Signed)
PROGRESS NOTE  HOLLACE TAWIL  [id number] DOB: [birth year] DOA: 06/16/2016 PCP: Penni Homans, MD  Brief Narrative:  60 y/o male heavy chronic smoker presented from ALF with progressive SOB, cough, fever and found in ED to have multifocal pneumonia, requiring bipap therapy, also has hyponatremia.  Pt has paranoid schizophrenia.    Assessment & Plan:   Active Problems:   Schizophrenia, paranoid ([place])   Tobacco use disorder   HTN (hypertension)   Respiratory failure with hypoxia (HCC)   Multifocal pneumonia   Leukocytosis   COPD exacerbation (HCC)  Acute respiratory failure with hypoxia secondary to multifocal pneumonia and acute COPD exacerbation, sounding better and O2 sat seems to be trending up slightly. -  Continue when necessary BiPAP with intermittent high flow nasal cannula -  Continue Zosyn and azithromycin, day 6 of 7 -  Broad differential to include IPF and PCP pneumonia if not continuing to improve -  HIV neg -  Legionella neg and s. pneumo neg -  Decrease steroids  -  Continue duonebs -  IS  Hypovolemic hyponatremia, resolved with IVF  Leukocytosis due to steroids and pneumonia, trending down slightly -  Taper steroids  Hypokalemia, continue oral potassium supplementation Paranoid schizophrenia, currently stable, continue home medications.  Needs prolixin infusion 11/22 Essential hypertension, BP stable, continue home medications Elevated LFTs, resolved with IVF  Normocytic anemia, hemoglobin approximately stable near 10.2 -  Repeat hgb in AM   DVT prophylaxis:  lovenox Code Status:  DNR/DNI Family Communication:  Patient alone Disposition Plan:  Pending improvement in O2 requirements   Consultants:   none  Procedures:  none  Antimicrobials:  Anti-infectives    Start     Dose/Rate Route Frequency Ordered Stop   06/19/16 0800  azithromycin (ZITHROMAX) tablet 500 mg     500 mg Oral Daily 06/19/16 0714 06/21/16 0922   06/18/16 0900   vancomycin (VANCOCIN) IVPB 1000 mg/200 mL premix  Status:  Discontinued     1,000 mg 200 mL/hr over 60 Minutes Intravenous Every 8 hours 06/18/16 0803 06/20/16 0815   06/18/16 0900  piperacillin-tazobactam (ZOSYN) IVPB 3.375 g     3.375 g 12.5 mL/hr over 240 Minutes Intravenous Every 8 hours 06/18/16 0803     06/17/16 0600  levofloxacin (LEVAQUIN) IVPB 750 mg  Status:  Discontinued     750 mg 100 mL/hr over 90 Minutes Intravenous Every 24 hours 06/16/16 1114 06/18/16 0752   06/16/16 1115  levofloxacin (LEVAQUIN) IVPB 750 mg  Status:  Discontinued     750 mg 100 mL/hr over 90 Minutes Intravenous Every 24 hours 06/16/16 1114 06/16/16 1116   06/16/16 0430  levofloxacin (LEVAQUIN) IVPB 750 mg     750 mg 100 mL/hr over 90 Minutes Intravenous  Once 06/16/16 0422 06/16/16 0617         Subjective:  Persistent cough.  Feels like he can breath better tdoay.  Still no BM but refusing stool softeners  Objective: Vitals:   06/21/16 0800 06/21/16 0821 06/21/16 1100 06/21/16 1205  BP: (!) 135/57   124/72  Pulse:      Resp: (!) 21  (!) 23 (!) 21  Temp:      TempSrc:      SpO2: (!) 87% 94% 94% 92%  Weight:      Height:        Intake/Output Summary (Last 24 hours) at 06/21/16 1212 Last data filed at 06/21/16 1200  Gross per 24 hour  Intake  1920 ml  Output             2100 ml  Net             -180 ml   Filed Weights   06/16/16 0800 06/20/16 0500  Weight: 78.7 kg (173 lb 8 oz) 73.8 kg (162 lb 11.2 oz)    Examination:  General exam:  Adult male, thin, No acute distress on high flow [place].  HEENT:  NCAT, MMM Respiratory system:  Improved aeration, especially at the deep bases, rales at the left base have improved, no rhonchi.  Cardiovascular system: Regular rate and rhythm, normal S1/S2. No murmurs, rubs, gallops or clicks.  Warm extremities Gastrointestinal system: Normal active bowel sounds, soft, nondistended, nontender. MSK:  Normal tone and bulk, no lower extremity  edema Neuro:  Grossly intact    Data Reviewed: I have personally reviewed following labs and imaging studies  CBC:  Recent Labs Lab 06/16/16 0345 06/17/16 0332 06/18/16 0740 06/19/16 0303 06/20/16 0318 06/21/16 0303  WBC 26.6* 25.5* 26.9* 22.6* 26.6* 25.3*  NEUTROABS 24.5*  --   --  21.5*  --   --   HGB 12.8* 10.3* 11.0* 10.2* 11.7* 12.1*  HCT 34.9* 29.7* 33.5* 30.2* 35.4* 36.5*  MCV 87.7 91.4 94.6 91.8 95.2 95.3  PLT 385 305 225 162 178 Q000111Q   Basic Metabolic Panel:  Recent Labs Lab 06/17/16 0332 06/18/16 0740 06/19/16 0303 06/20/16 0318 06/21/16 0303  NA 137 137 136 134* 138  K 3.4* 3.6 3.4* 3.8 4.1  CL 106 102 98* 91* 97*  CO2 25 27 32 34* 33*  GLUCOSE 155* 126* 161* 201* 124*  BUN 13 11 20  22* 17  CREATININE 0.60* 0.58* 0.83 1.00 0.67  CALCIUM 7.7* 7.8* 7.9* 8.1* 8.1*   GFR: Estimated Creatinine Clearance: 101.2 mL/min (by C-G formula based on SCr of 0.67 mg/dL). Liver Function Tests:  Recent Labs Lab 06/16/16 0456 06/17/16 0332 06/19/16 0303  AST 81* 43* 27  ALT 36 33 27  ALKPHOS 77 56 72  BILITOT 2.0* 0.9 0.7  PROT 5.9* 4.9* 5.3*  ALBUMIN 3.0* 2.3* 2.4*   No results for input(s): LIPASE, AMYLASE in the last 168 hours. No results for input(s): AMMONIA in the last 168 hours. Coagulation Profile:  Recent Labs Lab 06/17/16 0332  INR 1.32   Cardiac Enzymes: No results for input(s): CKTOTAL, CKMB, CKMBINDEX, TROPONINI in the last 168 hours. BNP (last 3 results) No results for input(s): PROBNP in the last 8760 hours. HbA1C: No results for input(s): HGBA1C in the last 72 hours. CBG: No results for input(s): GLUCAP in the last 168 hours. Lipid Profile: No results for input(s): CHOL, HDL, LDLCALC, TRIG, CHOLHDL, LDLDIRECT in the last 72 hours. Thyroid Function Tests: No results for input(s): TSH, T4TOTAL, FREET4, T3FREE, THYROIDAB in the last 72 hours. Anemia Panel: No results for input(s): VITAMINB12, FOLATE, FERRITIN, TIBC, IRON, RETICCTPCT  in the last 72 hours. Urine analysis:    Component Value Date/Time   COLORURINE YELLOW 06/16/2016 0548   APPEARANCEUR CLEAR 06/16/2016 0548   LABSPEC 1.009 06/16/2016 0548   PHURINE 6.5 06/16/2016 0548   GLUCOSEU NEGATIVE 06/16/2016 0548   HGBUR LARGE (A) 06/16/2016 0548   BILIRUBINUR NEGATIVE 06/16/2016 0548   KETONESUR NEGATIVE 06/16/2016 0548   PROTEINUR NEGATIVE 06/16/2016 0548   NITRITE NEGATIVE 06/16/2016 0548   LEUKOCYTESUR NEGATIVE 06/16/2016 0548   Sepsis Labs: @LABRCNTIP (procalcitonin:4,lacticidven:4)  ) Recent Results (from the past 240 hour(s))  Blood Culture (routine x 2)  Status: None (Preliminary result)   Collection Time: 06/16/16  4:32 AM  Result Value Ref Range Status   Specimen Description BLOOD LEFT FOREARM  Final   Special Requests BOTTLES DRAWN AEROBIC AND ANAEROBIC 5ML  Final   Culture   Final    NO GROWTH 4 DAYS Performed at [facility]    Report Status PENDING  Incomplete  Blood Culture (routine x 2)     Status: None (Preliminary result)   Collection Time: 06/16/16  4:32 AM  Result Value Ref Range Status   Specimen Description BLOOD LEFT FOREARM LOWER  Final   Special Requests BOTTLES DRAWN AEROBIC AND ANAEROBIC 5ML  Final   Culture   Final    NO GROWTH 4 DAYS Performed at [facility]    Report Status PENDING  Incomplete  Urine culture     Status: None   Collection Time: 06/16/16  5:48 AM  Result Value Ref Range Status   Specimen Description URINE, CLEAN CATCH  Final   Special Requests NONE  Final   Culture NO GROWTH Performed at [facility]   Final   Report Status 06/17/2016 FINAL  Final  MRSA PCR Screening     Status: None   Collection Time: 06/16/16  8:01 AM  Result Value Ref Range Status   MRSA by PCR NEGATIVE NEGATIVE Final    Comment:        The GeneXpert MRSA Assay (FDA approved for NASAL specimens only), is one component of a comprehensive MRSA colonization surveillance program. It is not intended  to diagnose MRSA infection nor to guide or monitor treatment for MRSA infections.       Radiology Studies: No results found.   Scheduled Meds: . albuterol  2.5 mg Nebulization TID  . atenolol  25 mg Oral BID  . benztropine  2 mg Oral Daily  . budesonide (PULMICORT) nebulizer solution  0.25 mg Nebulization BID  . busPIRone  10 mg Oral TID  . clonazePAM  1 mg Oral BID  . enoxaparin (LOVENOX) injection  40 mg Subcutaneous Q24H  . [START ON 06/26/2016] fluPHENAZine decanoate  75 mg Intramuscular Q14 Days  . mouth rinse  15 mL Mouth Rinse BID  . methylPREDNISolone (SOLU-MEDROL) injection  60 mg Intravenous Q6H  . nicotine  21 mg Transdermal Daily  . piperacillin-tazobactam (ZOSYN)  IV  3.375 g Intravenous Q8H  . polyethylene glycol  17 g Oral Daily  . senna  2 tablet Oral QHS  . sertraline  100 mg Oral BID  . sodium chloride flush  3 mL Intravenous Q12H  . tiotropium  18 mcg Inhalation Daily   Continuous Infusions:   LOS: 5 days    Time spent: 30 min    Janece Canterbury, MD [facility] Pager [phone]  If 7PM-7AM, please contact night-coverage [email or web address] Password [email or web address] 06/21/2016, 12:12 PM

## 2016-06-22 ENCOUNTER — Inpatient Hospital Stay (HOSPITAL_COMMUNITY): Payer: Medicare Other

## 2016-06-22 DIAGNOSIS — J80 Acute respiratory distress syndrome: Secondary | ICD-10-CM

## 2016-06-22 DIAGNOSIS — J9601 Acute respiratory failure with hypoxia: Secondary | ICD-10-CM

## 2016-06-22 LAB — BLOOD GAS, ARTERIAL
ACID-BASE EXCESS: 5.5 mmol/L — AB (ref 0.0–2.0)
BICARBONATE: 30 mmol/L — AB (ref 20.0–28.0)
Drawn by: 232811
FIO2: 1
O2 Saturation: 83.8 %
PCO2 ART: 44 mmHg (ref 32.0–48.0)
PH ART: 7.446 (ref 7.350–7.450)
Patient temperature: 98
pO2, Arterial: 50.5 mmHg — ABNORMAL LOW (ref 83.0–108.0)

## 2016-06-22 LAB — SEDIMENTATION RATE: SED RATE: 22 mm/h — AB (ref 0–16)

## 2016-06-22 LAB — ECHOCARDIOGRAM COMPLETE
HEIGHTINCHES: 79 in
Weight: 2543.23 oz

## 2016-06-22 LAB — PROCALCITONIN: PROCALCITONIN: 0.13 ng/mL

## 2016-06-22 MED ORDER — SALINE SPRAY 0.65 % NA SOLN
1.0000 | NASAL | Status: DC | PRN
  Administered 2016-06-22: 1 via NASAL
  Filled 2016-06-22: qty 44

## 2016-06-22 MED ORDER — OXYMETAZOLINE HCL 0.05 % NA SOLN
1.0000 | Freq: Two times a day (BID) | NASAL | Status: DC | PRN
  Administered 2016-06-22: 1 via NASAL
  Filled 2016-06-22: qty 15

## 2016-06-22 MED ORDER — IPRATROPIUM-ALBUTEROL 0.5-2.5 (3) MG/3ML IN SOLN
3.0000 mL | RESPIRATORY_TRACT | Status: DC | PRN
  Administered 2016-06-22: 3 mL via RESPIRATORY_TRACT
  Filled 2016-06-22: qty 3

## 2016-06-22 MED ORDER — WHITE PETROLATUM GEL
Status: DC | PRN
  Filled 2016-06-22: qty 5

## 2016-06-22 NOTE — Progress Notes (Signed)
PROGRESS NOTE  Jeffrey Ochoa  [id number] DOB: Oct 11, 1955 DOA: 06/16/2016 PCP: Penni Homans, MD  Brief Narrative:  60 y/o male heavy chronic smoker presented from ALF with progressive SOB, cough, fever and found in ED to have multifocal pneumonia, requiring bipap therapy, also has hyponatremia.  Pt has paranoid schizophrenia.    Assessment & Plan:   Active Problems:   Schizophrenia, paranoid ([place])   Tobacco use disorder   HTN (hypertension)   Respiratory failure with hypoxia (HCC)   Multifocal pneumonia   Leukocytosis   COPD exacerbation (HCC)  Acute respiratory failure with hypoxia initially attributed to multifocal pneumonia and acute COPD exacerbation, however, he continued to have high oxygen requirements.  Attempted diuresis with minimal improvement in O2 levels.  HIV negative.   -  Pulmonary consult today for evaluation for ILD.   -  Continue high flow nasal cannula -  Discontinue Zosyn and azithromycin -  HIV neg -  Legionella neg and s. pneumo neg -  Continue steroids  -  Continue duonebs -  F/u vasculitis labs, ECHO and CT scan ordered by pulm  Hypovolemic hyponatremia, resolved with IVF  Leukocytosis due to steroids and pneumonia, trending down slightly -  Taper steroids  Hypokalemia, continue oral potassium supplementation Paranoid schizophrenia, currently stable, continue home medications.  Needs prolixin infusion 11/22 Essential hypertension, BP stable, continue home medications Elevated LFTs, resolved with IVF  Normocytic anemia, hemoglobin approximately stable near 10.2 -  Repeat hgb in AM   DVT prophylaxis:  lovenox Code Status:  DNR/DNI Family Communication:  Patient alone Disposition Plan:  Pending improvement in O2 requirements   Consultants:   Pulmonology  Procedures:  none  Antimicrobials:  Anti-infectives    Start     Dose/Rate Route Frequency Ordered Stop   06/19/16 0800  azithromycin (ZITHROMAX) tablet 500 mg     500 mg Oral  Daily 06/19/16 0714 06/21/16 0922   06/18/16 0900  vancomycin (VANCOCIN) IVPB 1000 mg/200 mL premix  Status:  Discontinued     1,000 mg 200 mL/hr over 60 Minutes Intravenous Every 8 hours 06/18/16 0803 06/20/16 0815   06/18/16 0900  piperacillin-tazobactam (ZOSYN) IVPB 3.375 g  Status:  Discontinued     3.375 g 12.5 mL/hr over 240 Minutes Intravenous Every 8 hours 06/18/16 0803 06/22/16 1144   06/17/16 0600  levofloxacin (LEVAQUIN) IVPB 750 mg  Status:  Discontinued     750 mg 100 mL/hr over 90 Minutes Intravenous Every 24 hours 06/16/16 1114 06/18/16 0752   06/16/16 1115  levofloxacin (LEVAQUIN) IVPB 750 mg  Status:  Discontinued     750 mg 100 mL/hr over 90 Minutes Intravenous Every 24 hours 06/16/16 1114 06/16/16 1116   06/16/16 0430  levofloxacin (LEVAQUIN) IVPB 750 mg     750 mg 100 mL/hr over 90 Minutes Intravenous  Once 06/16/16 0422 06/16/16 0617         Subjective:  Persistent cough.  Breathing difficulties unchanged for last 3 days.  Still on 14L HFNC and desat with minimal exertion.  Had BMs this morning  Objective: Vitals:   06/22/16 0800 06/22/16 0900 06/22/16 1000 06/22/16 1100  BP: 129/74     Pulse:      Resp: (!) 24 18 (!) 31 (!) 24  Temp: 97.5 F (36.4 C)     TempSrc: Oral     SpO2: 99% 95% (!) 89% 99%  Weight:      Height:        Intake/Output Summary (Last 24  hours) at 06/22/16 1144 Last data filed at 06/22/16 [id number]  Gross per 24 hour  Intake             1210 ml  Output             3100 ml  Net            -1890 ml   Filed Weights   06/16/16 0800 06/20/16 0500 06/22/16 0600  Weight: 78.7 kg (173 lb 8 oz) 73.8 kg (162 lb 11.2 oz) 72.1 kg (158 lb 15.2 oz)    Examination:  General exam:  Adult male, thin, No acute distress on high flow [place].  HEENT:  NCAT, MMM Respiratory system:  Improved aeration, especially at the deep bases, persistent rales at the left base, no rhonchi.  Cardiovascular system: Regular rate and rhythm, normal S1/S2. No murmurs,  rubs, gallops or clicks.  Warm extremities Gastrointestinal system: Normal active bowel sounds, soft, nondistended, nontender. MSK:  Normal tone and bulk, no lower extremity edema Neuro:  Grossly intact    Data Reviewed: I have personally reviewed following labs and imaging studies  CBC:  Recent Labs Lab 06/16/16 0345 06/17/16 0332 06/18/16 0740 06/19/16 0303 06/20/16 0318 06/21/16 0303  WBC 26.6* 25.5* 26.9* 22.6* 26.6* 25.3*  NEUTROABS 24.5*  --   --  21.5*  --   --   HGB 12.8* 10.3* 11.0* 10.2* 11.7* 12.1*  HCT 34.9* 29.7* 33.5* 30.2* 35.4* 36.5*  MCV 87.7 91.4 94.6 91.8 95.2 95.3  PLT 385 305 225 162 178 Q000111Q   Basic Metabolic Panel:  Recent Labs Lab 06/17/16 0332 06/18/16 0740 06/19/16 0303 06/20/16 0318 06/21/16 0303  NA 137 137 136 134* 138  K 3.4* 3.6 3.4* 3.8 4.1  CL 106 102 98* 91* 97*  CO2 25 27 32 34* 33*  GLUCOSE 155* 126* 161* 201* 124*  BUN 13 11 20  22* 17  CREATININE 0.60* 0.58* 0.83 1.00 0.67  CALCIUM 7.7* 7.8* 7.9* 8.1* 8.1*   GFR: Estimated Creatinine Clearance: 98.9 mL/min (by C-G formula based on SCr of 0.67 mg/dL). Liver Function Tests:  Recent Labs Lab 06/16/16 0456 06/17/16 0332 06/19/16 0303  AST 81* 43* 27  ALT 36 33 27  ALKPHOS 77 56 72  BILITOT 2.0* 0.9 0.7  PROT 5.9* 4.9* 5.3*  ALBUMIN 3.0* 2.3* 2.4*   No results for input(s): LIPASE, AMYLASE in the last 168 hours. No results for input(s): AMMONIA in the last 168 hours. Coagulation Profile:  Recent Labs Lab 06/17/16 0332  INR 1.32   Cardiac Enzymes: No results for input(s): CKTOTAL, CKMB, CKMBINDEX, TROPONINI in the last 168 hours. BNP (last 3 results) No results for input(s): PROBNP in the last 8760 hours. HbA1C: No results for input(s): HGBA1C in the last 72 hours. CBG: No results for input(s): GLUCAP in the last 168 hours. Lipid Profile: No results for input(s): CHOL, HDL, LDLCALC, TRIG, CHOLHDL, LDLDIRECT in the last 72 hours. Thyroid Function Tests: No  results for input(s): TSH, T4TOTAL, FREET4, T3FREE, THYROIDAB in the last 72 hours. Anemia Panel: No results for input(s): VITAMINB12, FOLATE, FERRITIN, TIBC, IRON, RETICCTPCT in the last 72 hours. Urine analysis:    Component Value Date/Time   COLORURINE YELLOW 06/16/2016 0548   APPEARANCEUR CLEAR 06/16/2016 0548   LABSPEC 1.009 06/16/2016 0548   PHURINE 6.5 06/16/2016 0548   GLUCOSEU NEGATIVE 06/16/2016 0548   HGBUR LARGE (A) 06/16/2016 0548   BILIRUBINUR NEGATIVE 06/16/2016 0548   KETONESUR NEGATIVE 06/16/2016 0548   PROTEINUR NEGATIVE  06/16/2016 0548   NITRITE NEGATIVE 06/16/2016 0548   LEUKOCYTESUR NEGATIVE 06/16/2016 0548   Sepsis Labs: @LABRCNTIP (procalcitonin:4,lacticidven:4)  ) Recent Results (from the past 240 hour(s))  Blood Culture (routine x 2)     Status: None   Collection Time: 06/16/16  4:32 AM  Result Value Ref Range Status   Specimen Description BLOOD LEFT FOREARM  Final   Special Requests BOTTLES DRAWN AEROBIC AND ANAEROBIC 5ML  Final   Culture   Final    NO GROWTH 5 DAYS Performed at [facility]    Report Status 06/21/2016 FINAL  Final  Blood Culture (routine x 2)     Status: None   Collection Time: 06/16/16  4:32 AM  Result Value Ref Range Status   Specimen Description BLOOD LEFT FOREARM LOWER  Final   Special Requests BOTTLES DRAWN AEROBIC AND ANAEROBIC 5ML  Final   Culture   Final    NO GROWTH 5 DAYS Performed at [facility]    Report Status 06/21/2016 FINAL  Final  Urine culture     Status: None   Collection Time: 06/16/16  5:48 AM  Result Value Ref Range Status   Specimen Description URINE, CLEAN CATCH  Final   Special Requests NONE  Final   Culture NO GROWTH Performed at [facility]   Final   Report Status 06/17/2016 FINAL  Final  MRSA PCR Screening     Status: None   Collection Time: 06/16/16  8:01 AM  Result Value Ref Range Status   MRSA by PCR NEGATIVE NEGATIVE Final    Comment:        The GeneXpert MRSA  Assay (FDA approved for NASAL specimens only), is one component of a comprehensive MRSA colonization surveillance program. It is not intended to diagnose MRSA infection nor to guide or monitor treatment for MRSA infections.       Radiology Studies: Dg Chest Port 1 View  Result Date: 06/22/2016 CLINICAL DATA:  Hypoxia, pneumonia EXAM: PORTABLE CHEST 1 VIEW COMPARISON:  06/19/2016 FINDINGS: Diffuse bilateral airspace opacities are again noted, left greater than right. Heart is normal size. No visible effusions or pneumothorax. No acute bony abnormality. IMPRESSION: Stable diffuse bilateral airspace opacities, left greater than right concerning for multifocal pneumonia. Electronically Signed   By: Rolm Baptise M.D.   On: 06/22/2016 07:49     Scheduled Meds: . albuterol  2.5 mg Nebulization TID  . atenolol  25 mg Oral BID  . benztropine  2 mg Oral Daily  . budesonide (PULMICORT) nebulizer solution  0.25 mg Nebulization BID  . busPIRone  10 mg Oral TID  . clonazePAM  1 mg Oral BID  . enoxaparin (LOVENOX) injection  40 mg Subcutaneous Q24H  . [START ON 06/26/2016] fluPHENAZine decanoate  75 mg Intramuscular Q14 Days  . mouth rinse  15 mL Mouth Rinse BID  . methylPREDNISolone (SOLU-MEDROL) injection  60 mg Intravenous BID  . nicotine  21 mg Transdermal Daily  . polyethylene glycol  17 g Oral Daily  . senna  2 tablet Oral QHS  . sertraline  100 mg Oral BID  . sodium chloride flush  3 mL Intravenous Q12H  . tiotropium  18 mcg Inhalation Daily   Continuous Infusions:   LOS: 6 days    Time spent: 30 min    Janece Canterbury, MD [facility] Pager [phone]  If 7PM-7AM, please contact night-coverage [email or web address] Password [email or web address] 06/22/2016, 11:44 AM

## 2016-06-22 NOTE — Consult Note (Signed)
PULMONARY / CRITICAL CARE MEDICINE   Name: Jeffrey Ochoa MRN: [id number] DOB: 06/12/1956    ADMISSION DATE:  06/16/2016 CONSULTATION DATE:  06/22/2016   REFERRING MD:   Dr Racheal Patches- Short  CHIEF COMPLAINT:  persistent Acute hypoxemic respiratory failure with infiltrates  HISTORY OF PRESENT ILLNESS:   86 yeard old schizophrenic and DNR used to live under care of parents and since they are decased lives in assistend living. Prior melanoma to back, ongoing tobabcco abuse. Presented 06/16/2016 with 4 days of cough but without sick contact or myalgia or diarrhea or dysuria or blurred vision. Only some sputum but unclear if there was fever. CXR with bilateral dffisue ALI pattern (PCCM MD opinioin ) with LLL consolidation pattern. Since admit, HIV negative, urine leg, urine strep and flu PCR negaive. Rx with steroids, antibiotics and lasix and per RN atleast since 06/18/16 been in 15L [place] without improvement. CXR 06/22/2016 shows persistence of infiltrates since admit without change (last CXR 2015 that just showed hyperinflation). Afebrile throughgout staty. Therefore PCCM consuled. Steroids ongoing  PAST MEDICAL HISTORY :  He  has a past medical history of Chicken pox (as a child); HTN (hypertension) (12/05/2013); Hypertension; Measles (as a child); Melanoma of back ([place]); Mumps (as a child); Schizophrenia, paranoid ([place]) (1985); Tobacco use disorder (12/05/2013); and Unspecified hereditary and idiopathic peripheral neuropathy (12/05/2013).  PAST SURGICAL HISTORY: He  has a past surgical history that includes Cholecystectomy (1985); Wisdom tooth extraction; Multiple tooth extractions; and Mohs surgery.  Allergies  Allergen Reactions  . Risperidone And Related Other (See Comments)    Makes him Manic     No current facility-administered medications on file prior to encounter.    Current Outpatient Prescriptions on File Prior to Encounter  Medication Sig  . acetaminophen (TYLENOL) 500 MG tablet  Take 1 tablet (500 mg total) by mouth every 6 (six) hours as needed for mild pain or moderate pain.  Marland Kitchen atenolol (TENORMIN) 25 MG tablet Take 25 mg by mouth 2 (two) times daily.  . benztropine (COGENTIN) 2 MG tablet Take 2 mg by mouth 2 (two) times daily.   . fluPHENAZine decanoate (PROLIXIN) 25 MG/ML injection Inject 75 mg into the muscle every 14 (fourteen) days.  Marland Kitchen sertraline (ZOLOFT) 50 MG tablet Take 100 mg by mouth 2 (two) times daily.   . clonazePAM (KLONOPIN) 1 MG tablet Take 0.5 mg by mouth daily as needed for anxiety.     FAMILY HISTORY:  His indicated that his mother is alive. He indicated that his father is deceased. He indicated that his sister is alive. He indicated that his maternal grandmother is deceased. He indicated that his maternal grandfather is deceased. He indicated that his paternal grandmother is deceased. He indicated that his paternal grandfather is deceased.    SOCIAL HISTORY: He  reports that he has been smoking Cigarettes.  He has been smoking about 2.00 packs per day. He has quit using smokeless tobacco. He reports that he uses drugs, including Marijuana. He reports that he does not drink alcohol.  REVIEW OF SYSTEMS:   unelicitable due to pscyh hx and resp failure   VITAL SIGNS: BP 129/74 (BP Location: Right Arm)   Pulse 78   Temp 97.2 F (36.2 C) (Oral)   Resp (!) 24   Ht 6\' 7"  (2.007 m)   Wt 72.1 kg (158 lb 15.2 oz)   SpO2 99%   BMI 17.91 kg/m   HEMODYNAMICS:    VENTILATOR SETTINGS:    INTAKE / OUTPUT:  I/O last 3 completed shifts: In: 2380 [P.O.:2100; Other:80; IV Piggyback:200] Out: 5300 [Urine:5300]  PHYSICAL EXAMINATION: General:  Frail male sitting on bed Neuro:  Alert and oriented x 3 HEENT:  No elevated JVP. No neck nodes.  Cardiovascular:  Normal heart sounds Lungs:  Bilateral crackles. Only mild increased wob. On 15L [place] Abdomen:  Soft, non tender Musculoskeletal:  No edema Skin:  Is dry and  intact  LABS:  PULMONARY  Recent Labs Lab 06/16/16 0349 06/16/16 0930 06/18/16 0748  PHART  --  7.409 7.392  PCO2ART  --  32.6 45.9  PO2ART  --  77.7* 72.6*  HCO3 19.3* 20.2 27.3  O2SAT 89.7 94.1 93.6    CBC  Recent Labs Lab 06/19/16 0303 06/20/16 0318 06/21/16 0303  HGB 10.2* 11.7* 12.1*  HCT 30.2* 35.4* 36.5*  WBC 22.6* 26.6* 25.3*  PLT 162 178 159    COAGULATION  Recent Labs Lab 06/17/16 0332  INR 1.32    CARDIAC  No results for input(s): TROPONINI in the last 168 hours. No results for input(s): PROBNP in the last 168 hours.   CHEMISTRY  Recent Labs Lab 06/17/16 0332 06/18/16 0740 06/19/16 0303 06/20/16 0318 06/21/16 0303  NA 137 137 136 134* 138  K 3.4* 3.6 3.4* 3.8 4.1  CL 106 102 98* 91* 97*  CO2 25 27 32 34* 33*  GLUCOSE 155* 126* 161* 201* 124*  BUN 13 11 20  22* 17  CREATININE 0.60* 0.58* 0.83 1.00 0.67  CALCIUM 7.7* 7.8* 7.9* 8.1* 8.1*   Estimated Creatinine Clearance: 98.9 mL/min (by C-G formula based on SCr of 0.67 mg/dL).   LIVER  Recent Labs Lab 06/16/16 0456 06/17/16 0332 06/19/16 0303  AST 81* 43* 27  ALT 36 33 27  ALKPHOS 77 56 72  BILITOT 2.0* 0.9 0.7  PROT 5.9* 4.9* 5.3*  ALBUMIN 3.0* 2.3* 2.4*  INR  --  1.32  --      INFECTIOUS  Recent Labs Lab 06/16/16 0438 06/16/16 0747  LATICACIDVEN 1.88 1.28     ENDOCRINE CBG (last 3)  No results for input(s): GLUCAP in the last 72 hours.       IMAGING x48h  - image(s) personally visualized  -   highlighted in bold Dg Chest Port 1 View  Result Date: 06/22/2016 CLINICAL DATA:  Hypoxia, pneumonia EXAM: PORTABLE CHEST 1 VIEW COMPARISON:  06/19/2016 FINDINGS: Diffuse bilateral airspace opacities are again noted, left greater than right. Heart is normal size. No visible effusions or pneumothorax. No acute bony abnormality. IMPRESSION: Stable diffuse bilateral airspace opacities, left greater than right concerning for multifocal pneumonia. Electronically Signed    By: Rolm Baptise M.D.   On: 06/22/2016 07:49       ASSESSMENT / PLAN:  PULMONARY A: # Known tobacco abuse.  Last pre-admit  CXR 2015 with hyperinflation only Otherwise baseline status unknown  #currently Acute hypoxoemic resp failure with ALI pattern on CXR- unimproved since admit despite steroids, diuresis and antibiotics  Above presentation features are typical of acute booop, idiopathich ALI  (former [place]), Acute neutrophilic/eosinophilic pneumonia, Undiagnosed IPF or HP   Flare, vasculitis, other respiratory virus related ALI, alv hge (but hgb stable)    P:   Resp virus panel HRCT chest Get echo Get autoimmuen and vasculitis panel  Continue high flow o2 Continue steroid sand abx for now (check PCT and if normal dc abx)  Might need bronch bal     The patient is critically ill with multiple organ systems failure  and requires high complexity decision making for assessment and support, frequent evaluation and titration of therapies, application of advanced monitoring technologies and extensive interpretation of multiple databases.   Critical Care Time devoted to patient care services described in this note is  30  Minutes. This time reflects time of care of this signee Dr Brand Males. This critical care time does not reflect procedure time, or teaching time or supervisory time of PA/NP/Med student/Med Resident etc but could involve care discussion time    Dr. Brand Males, M.D., [facility].C.P Pulmonary and Critical Care Medicine Staff Physician [place] [facility] Pager: [phone], If no answer or between  15:00h - 7:00h: call [phone]  06/22/2016 8:58 AM         Dr. Brand Males, M.D., [employer].C.P Pulmonary and Critical Care Medicine Staff Physician [place] [facility] Pager: [phone], If no answer or between  15:00h - 7:00h: call [phone]  06/22/2016 8:37 AM

## 2016-06-22 NOTE — Progress Notes (Signed)
  Echocardiogram 2D Echocardiogram has been performed.  Diamond Nickel 06/22/2016, 10:53 AM

## 2016-06-22 NOTE — Progress Notes (Signed)
Pt. Arrived to floor from ICU via wheelchair. Alert and oriented to person and time. O2 15L/[place], no respiratory distress noted.

## 2016-06-23 DIAGNOSIS — J9621 Acute and chronic respiratory failure with hypoxia: Secondary | ICD-10-CM

## 2016-06-23 LAB — BASIC METABOLIC PANEL
ANION GAP: 7 (ref 5–15)
BUN: 19 mg/dL (ref 6–20)
CALCIUM: 8.1 mg/dL — AB (ref 8.9–10.3)
CO2: 32 mmol/L (ref 22–32)
CREATININE: 0.71 mg/dL (ref 0.61–1.24)
Chloride: 97 mmol/L — ABNORMAL LOW (ref 101–111)
Glucose, Bld: 124 mg/dL — ABNORMAL HIGH (ref 65–99)
Potassium: 5 mmol/L (ref 3.5–5.1)
SODIUM: 136 mmol/L (ref 135–145)

## 2016-06-23 LAB — PROCALCITONIN

## 2016-06-23 LAB — MAGNESIUM: MAGNESIUM: 2.4 mg/dL (ref 1.7–2.4)

## 2016-06-23 LAB — CBC
HEMATOCRIT: 39.5 % (ref 39.0–52.0)
Hemoglobin: 13 g/dL (ref 13.0–17.0)
MCH: 31.5 pg (ref 26.0–34.0)
MCHC: 32.9 g/dL (ref 30.0–36.0)
MCV: 95.6 fL (ref 78.0–100.0)
Platelets: 170 10*3/uL (ref 150–400)
RBC: 4.13 MIL/uL — ABNORMAL LOW (ref 4.22–5.81)
RDW: 13.9 % (ref 11.5–15.5)
WBC: 22.7 10*3/uL — AB (ref 4.0–10.5)

## 2016-06-23 LAB — RESPIRATORY PANEL BY PCR
Adenovirus: NOT DETECTED
BORDETELLA PERTUSSIS-RVPCR: NOT DETECTED
CHLAMYDOPHILA PNEUMONIAE-RVPPCR: NOT DETECTED
CORONAVIRUS 229E-RVPPCR: NOT DETECTED
CORONAVIRUS HKU1-RVPPCR: NOT DETECTED
Coronavirus NL63: NOT DETECTED
Coronavirus OC43: NOT DETECTED
INFLUENZA B-RVPPCR: NOT DETECTED
Influenza A: NOT DETECTED
Metapneumovirus: NOT DETECTED
Mycoplasma pneumoniae: NOT DETECTED
PARAINFLUENZA VIRUS 3-RVPPCR: NOT DETECTED
Parainfluenza Virus 1: NOT DETECTED
Parainfluenza Virus 2: NOT DETECTED
Parainfluenza Virus 4: NOT DETECTED
RESPIRATORY SYNCYTIAL VIRUS-RVPPCR: NOT DETECTED
RHINOVIRUS / ENTEROVIRUS - RVPPCR: NOT DETECTED

## 2016-06-23 LAB — PHOSPHORUS: PHOSPHORUS: 4.9 mg/dL — AB (ref 2.5–4.6)

## 2016-06-23 MED ORDER — ATORVASTATIN CALCIUM 40 MG PO TABS
40.0000 mg | ORAL_TABLET | Freq: Every day | ORAL | Status: DC
  Administered 2016-06-23 – 2016-06-24 (×2): 40 mg via ORAL
  Filled 2016-06-23 (×2): qty 1

## 2016-06-23 MED ORDER — ASPIRIN EC 81 MG PO TBEC
81.0000 mg | DELAYED_RELEASE_TABLET | Freq: Every day | ORAL | Status: DC
  Administered 2016-06-23 – 2016-06-24 (×2): 81 mg via ORAL
  Filled 2016-06-23 (×2): qty 1

## 2016-06-23 NOTE — Progress Notes (Signed)
Pt requesting to come off BIPAP at this time.  Pt placed on NRB mask and is currently tolerating well at this time.  RT to monitor and assess as needed.

## 2016-06-23 NOTE — Progress Notes (Signed)
RT explained to pt that MD wanted him to utilize BIPAP At [facility] but Pt has refused stating that he"can't wear that thing".  RT tried to explain that it was slightly different from the machine that he used in ICU but Pt still adamant about not wearing it.  RN notified, RT to monitor and assess as needed.

## 2016-06-23 NOTE — Progress Notes (Addendum)
PROGRESS NOTE  Jeffrey Ochoa  [id number] DOB: 07-Apr-1956 DOA: 06/16/2016 PCP: Penni Homans, MD  Brief Narrative:  60 y/o male heavy chronic smoker presented from ALF with progressive SOB, cough, fever and found in ED to have multifocal pneumonia, requiring bipap therapy, also has hyponatremia.  Pt has paranoid schizophrenia.  Patient has been slow to recover despite antibiotics and steroids and diuresis.  Pulmonology was consulted for consideration of noninfectious etiologies of pulmonary infiltrates and respiratory failure.    Assessment & Plan:   Active Problems:   Schizophrenia, paranoid ([place])   Tobacco use disorder   HTN (hypertension)   Respiratory failure with hypoxia (HCC)   Multifocal pneumonia   Leukocytosis   COPD exacerbation (HCC)  Acute respiratory failure with hypoxia initially attributed to multifocal pneumonia and acute COPD exacerbation, however, he continued to have high oxygen requirements.  Attempted diuresis with minimal improvement in O2 levels.  HIV negative.  CT demonstrates mixed ground glass and and subpleural consolidation.  -  Appreciate Pulmonary assistance   -  Continue high flow nasal cannula -  Routine nightly Bipap -  HIV neg -  Legionella neg and s. pneumo neg -  Continue steroids  -  Continue duonebs -  F/u vasculitis labs -  Respiratory viral panel negative -  ECHO:  Preserved EF, grade 1 DD  69mm RLL lung nodule -  Repeat CT chest in 6-12 months  Hypovolemic hyponatremia, resolved with IVF  Leukocytosis due to steroids and pneumonia, trending down slightly -  Continue steroids at current dose   CAD per CT, asymptomatic.  Start aspirin and statin.  Already on beta blocker Hypokalemia, continue oral potassium supplementation Paranoid schizophrenia, currently stable, continue home medications.  Needs prolixin infusion 11/22 Essential hypertension, BP stable, continue home medications Elevated LFTs, resolved with IVF  Normocytic  anemia, hemoglobin approximately stable near 10.2 -  Repeat hgb in AM   DVT prophylaxis:  lovenox Code Status:  DNR/DNI Family Communication:  Patient alone Disposition Plan:   To LTACH.  Case management consult placed   Consultants:   Pulmonology  Procedures:  none  Antimicrobials:  Anti-infectives    Start     Dose/Rate Route Frequency Ordered Stop   06/19/16 0800  azithromycin (ZITHROMAX) tablet 500 mg     500 mg Oral Daily 06/19/16 0714 06/21/16 0922   06/18/16 0900  vancomycin (VANCOCIN) IVPB 1000 mg/200 mL premix  Status:  Discontinued     1,000 mg 200 mL/hr over 60 Minutes Intravenous Every 8 hours 06/18/16 0803 06/20/16 0815   06/18/16 0900  piperacillin-tazobactam (ZOSYN) IVPB 3.375 g  Status:  Discontinued     3.375 g 12.5 mL/hr over 240 Minutes Intravenous Every 8 hours 06/18/16 0803 06/22/16 1144   06/17/16 0600  levofloxacin (LEVAQUIN) IVPB 750 mg  Status:  Discontinued     750 mg 100 mL/hr over 90 Minutes Intravenous Every 24 hours 06/16/16 1114 06/18/16 0752   06/16/16 1115  levofloxacin (LEVAQUIN) IVPB 750 mg  Status:  Discontinued     750 mg 100 mL/hr over 90 Minutes Intravenous Every 24 hours 06/16/16 1114 06/16/16 1116   06/16/16 0430  levofloxacin (LEVAQUIN) IVPB 750 mg     750 mg 100 mL/hr over 90 Minutes Intravenous  Once 06/16/16 0422 06/16/16 0617         Subjective:  Episode of hypoxia last night that did not improve with NRB overnight.  Sent back to Stepdown and placed on bipap overnight.  Did not like  bipap and glad back on nasal canula this morning.    Objective: Vitals:   06/23/16 0823 06/23/16 0925 06/23/16 1000 06/23/16 1157  BP:  (!) 124/57 (!) 102/53   Pulse:  86    Resp:   (!) 22   Temp:    97.3 F (36.3 C)  TempSrc:    Oral  SpO2: 92%  100%   Weight:      Height:        Intake/Output Summary (Last 24 hours) at 06/23/16 1305 Last data filed at 06/23/16 1100  Gross per 24 hour  Intake              240 ml  Output              1800 ml  Net            -1560 ml   Filed Weights   06/20/16 0500 06/22/16 0600 06/23/16 0000  Weight: 73.8 kg (162 lb 11.2 oz) 72.1 kg (158 lb 15.2 oz) 72.2 kg (159 lb 2.8 oz)    Examination:  General exam:  Adult male, thin, being transitioned to nasal canula by resp therapy HEENT:  NCAT, MMM Respiratory system:  Improved rales at the left base, no rhonchi.  No wheeze Cardiovascular system: Regular rate and rhythm, normal S1/S2. No murmurs, rubs, gallops or clicks.  Warm extremities Gastrointestinal system: Normal active bowel sounds, soft, nondistended, nontender. MSK:  Normal tone and bulk, no lower extremity edema Neuro:  Grossly intact    Data Reviewed: I have personally reviewed following labs and imaging studies  CBC:  Recent Labs Lab 06/18/16 0740 06/19/16 0303 06/20/16 0318 06/21/16 0303 06/23/16 0505  WBC 26.9* 22.6* 26.6* 25.3* 22.7*  NEUTROABS  --  21.5*  --   --   --   HGB 11.0* 10.2* 11.7* 12.1* 13.0  HCT 33.5* 30.2* 35.4* 36.5* 39.5  MCV 94.6 91.8 95.2 95.3 95.6  PLT 225 162 178 159 [id number]   Basic Metabolic Panel:  Recent Labs Lab 06/18/16 0740 06/19/16 0303 06/20/16 0318 06/21/16 0303 06/23/16 0505  NA 137 136 134* 138 136  K 3.6 3.4* 3.8 4.1 5.0  CL 102 98* 91* 97* 97*  CO2 27 32 34* 33* 32  GLUCOSE 126* 161* 201* 124* 124*  BUN 11 20 22* 17 19  CREATININE 0.58* 0.83 1.00 0.67 0.71  CALCIUM 7.8* 7.9* 8.1* 8.1* 8.1*  MG  --   --   --   --  2.4  PHOS  --   --   --   --  4.9*   GFR: Estimated Creatinine Clearance: 99 mL/min (by C-G formula based on SCr of 0.71 mg/dL). Liver Function Tests:  Recent Labs Lab 06/17/16 0332 06/19/16 0303  AST 43* 27  ALT 33 27  ALKPHOS 56 72  BILITOT 0.9 0.7  PROT 4.9* 5.3*  ALBUMIN 2.3* 2.4*   No results for input(s): LIPASE, AMYLASE in the last 168 hours. No results for input(s): AMMONIA in the last 168 hours. Coagulation Profile:  Recent Labs Lab 06/17/16 0332  INR 1.32   Cardiac  Enzymes: No results for input(s): CKTOTAL, CKMB, CKMBINDEX, TROPONINI in the last 168 hours. BNP (last 3 results) No results for input(s): PROBNP in the last 8760 hours. HbA1C: No results for input(s): HGBA1C in the last 72 hours. CBG: No results for input(s): GLUCAP in the last 168 hours. Lipid Profile: No results for input(s): CHOL, HDL, LDLCALC, TRIG, CHOLHDL, LDLDIRECT in the last 72 hours.  Thyroid Function Tests: No results for input(s): TSH, T4TOTAL, FREET4, T3FREE, THYROIDAB in the last 72 hours. Anemia Panel: No results for input(s): VITAMINB12, FOLATE, FERRITIN, TIBC, IRON, RETICCTPCT in the last 72 hours. Urine analysis:    Component Value Date/Time   COLORURINE YELLOW 06/16/2016 0548   APPEARANCEUR CLEAR 06/16/2016 0548   LABSPEC 1.009 06/16/2016 0548   PHURINE 6.5 06/16/2016 0548   GLUCOSEU NEGATIVE 06/16/2016 0548   HGBUR LARGE (A) 06/16/2016 0548   BILIRUBINUR NEGATIVE 06/16/2016 0548   KETONESUR NEGATIVE 06/16/2016 0548   PROTEINUR NEGATIVE 06/16/2016 0548   NITRITE NEGATIVE 06/16/2016 0548   LEUKOCYTESUR NEGATIVE 06/16/2016 0548   Sepsis Labs: @LABRCNTIP (procalcitonin:4,lacticidven:4)  ) Recent Results (from the past 240 hour(s))  Blood Culture (routine x 2)     Status: None   Collection Time: 06/16/16  4:32 AM  Result Value Ref Range Status   Specimen Description BLOOD LEFT FOREARM  Final   Special Requests BOTTLES DRAWN AEROBIC AND ANAEROBIC 5ML  Final   Culture   Final    NO GROWTH 5 DAYS Performed at [facility]    Report Status 06/21/2016 FINAL  Final  Blood Culture (routine x 2)     Status: None   Collection Time: 06/16/16  4:32 AM  Result Value Ref Range Status   Specimen Description BLOOD LEFT FOREARM LOWER  Final   Special Requests BOTTLES DRAWN AEROBIC AND ANAEROBIC 5ML  Final   Culture   Final    NO GROWTH 5 DAYS Performed at [facility]    Report Status 06/21/2016 FINAL  Final  Urine culture     Status: None    Collection Time: 06/16/16  5:48 AM  Result Value Ref Range Status   Specimen Description URINE, CLEAN CATCH  Final   Special Requests NONE  Final   Culture NO GROWTH Performed at [facility]   Final   Report Status 06/17/2016 FINAL  Final  MRSA PCR Screening     Status: None   Collection Time: 06/16/16  8:01 AM  Result Value Ref Range Status   MRSA by PCR NEGATIVE NEGATIVE Final    Comment:        The GeneXpert MRSA Assay (FDA approved for NASAL specimens only), is one component of a comprehensive MRSA colonization surveillance program. It is not intended to diagnose MRSA infection nor to guide or monitor treatment for MRSA infections.   Respiratory Panel by PCR     Status: None   Collection Time: 06/22/16 12:13 PM  Result Value Ref Range Status   Adenovirus NOT DETECTED NOT DETECTED Final   Coronavirus 229E NOT DETECTED NOT DETECTED Final   Coronavirus HKU1 NOT DETECTED NOT DETECTED Final   Coronavirus NL63 NOT DETECTED NOT DETECTED Final   Coronavirus OC43 NOT DETECTED NOT DETECTED Final   Metapneumovirus NOT DETECTED NOT DETECTED Final   Rhinovirus / Enterovirus NOT DETECTED NOT DETECTED Final   Influenza A NOT DETECTED NOT DETECTED Final   Influenza B NOT DETECTED NOT DETECTED Final   Parainfluenza Virus 1 NOT DETECTED NOT DETECTED Final   Parainfluenza Virus 2 NOT DETECTED NOT DETECTED Final   Parainfluenza Virus 3 NOT DETECTED NOT DETECTED Final   Parainfluenza Virus 4 NOT DETECTED NOT DETECTED Final   Respiratory Syncytial Virus NOT DETECTED NOT DETECTED Final   Bordetella pertussis NOT DETECTED NOT DETECTED Final   Chlamydophila pneumoniae NOT DETECTED NOT DETECTED Final   Mycoplasma pneumoniae NOT DETECTED NOT DETECTED Final    Comment: Performed  at [facility]      Radiology Studies: Ct Chest High Resolution  Result Date: 06/22/2016 CLINICAL DATA:  Cough, fever, shortness of breath and leukocytosis. Acute respiratory failure. EXAM: CT CHEST  WITHOUT CONTRAST TECHNIQUE: Multidetector CT imaging of the chest was performed following the standard protocol without intravenous contrast. High resolution imaging of the lungs, as well as inspiratory and expiratory imaging, was performed. COMPARISON:  Chest radiographs dating back to 04/05/2014. FINDINGS: Cardiovascular: Coronary artery calcification. Heart size normal. No pericardial effusion. Mediastinum/Nodes: Mediastinal lymph nodes are not enlarged by CT size criteria. Hilar regions are difficult to definitively evaluate without IV contrast. No axillary adenopathy. Esophagus is grossly unremarkable. Lungs/Pleura: There is fairly diffuse ground-glass with areas of mild subpleural consolidation. Findings are superimposed on centrilobular and likely paraseptal emphysema. Difficult to exclude a 7 mm nodule in the posterior right lower lobe (series 9, image 129). Trace left pleural effusion. Trace pleural fluid posteromedially in the inferior right hemi thorax. Upper Abdomen: Low-attenuation lesions in the liver measure up to 1.5 cm and are likely cysts although definitive characterization is limited due to size and lack of postcontrast imaging. Cholecystectomy. Visualized portions of the adrenal glands, kidneys, spleen, pancreas, stomach and bowel are grossly unremarkable. Musculoskeletal: No worrisome lytic or sclerotic lesions. Degenerative changes are seen in the spine. IMPRESSION: 1. Pulmonary parenchymal pattern of ground-glass and scattered subpleural consolidation are likely acute given the clinical history, in which case, atypical or viral pneumonia is favored. 2. **An incidental finding of potential clinical significance has been found. Difficult to exclude a 7 mm right lower lobe nodule. Non-contrast chest CT at 6-12 months is recommended. If the nodule is stable at time of repeat CT, then future CT at 18-24 months (from today's scan) is considered optional for low-risk patients, but is recommended for  high-risk patients. This recommendation follows the consensus statement: Guidelines for Management of Incidental Pulmonary Nodules Detected on CT Images: From the Fleischner Society 2017; Radiology 2017; 284:228-243. ** 3. Trace bilateral pleural effusions, left greater than right. 4. Emphysema (ICD10-J43.9). 5. Aortic atherosclerosis (ICD10-170.0). Coronary artery calcification. Electronically Signed   By: Lorin Picket M.D.   On: 06/22/2016 15:42   Dg Chest Port 1 View  Result Date: 06/22/2016 CLINICAL DATA:  Dyspnea.  Hypertension. EXAM: PORTABLE CHEST 1 VIEW COMPARISON:  06/22/2016, 06/16/2016, 04/05/2014 FINDINGS: Airspace opacities persist bilaterally, greatest in the left lower lobe. No significant change from the recent prior studies. Multifocal pneumonia is a leading consideration. No large effusions. IMPRESSION: No significant change in the multifocal airspace opacities. Electronically Signed   By: Andreas Newport M.D.   On: 06/22/2016 22:43   Dg Chest Port 1 View  Result Date: 06/22/2016 CLINICAL DATA:  Hypoxia, pneumonia EXAM: PORTABLE CHEST 1 VIEW COMPARISON:  06/19/2016 FINDINGS: Diffuse bilateral airspace opacities are again noted, left greater than right. Heart is normal size. No visible effusions or pneumothorax. No acute bony abnormality. IMPRESSION: Stable diffuse bilateral airspace opacities, left greater than right concerning for multifocal pneumonia. Electronically Signed   By: Rolm Baptise M.D.   On: 06/22/2016 07:49     Scheduled Meds: . albuterol  2.5 mg Nebulization TID  . atenolol  25 mg Oral BID  . benztropine  2 mg Oral Daily  . budesonide (PULMICORT) nebulizer solution  0.25 mg Nebulization BID  . busPIRone  10 mg Oral TID  . clonazePAM  1 mg Oral BID  . enoxaparin (LOVENOX) injection  40 mg Subcutaneous Q24H  . [START ON 06/26/2016]  fluPHENAZine decanoate  75 mg Intramuscular Q14 Days  . mouth rinse  15 mL Mouth Rinse BID  . methylPREDNISolone (SOLU-MEDROL)  injection  60 mg Intravenous BID  . nicotine  21 mg Transdermal Daily  . polyethylene glycol  17 g Oral Daily  . senna  2 tablet Oral QHS  . sertraline  100 mg Oral BID  . sodium chloride flush  3 mL Intravenous Q12H  . tiotropium  18 mcg Inhalation Daily   Continuous Infusions:   LOS: 7 days    Time spent: 30 min    Janece Canterbury, MD [facility] Pager [phone]  If 7PM-7AM, please contact night-coverage [email or web address] Password [email or web address] 06/23/2016, 1:05 PM

## 2016-06-23 NOTE — Progress Notes (Signed)
RN notified that the patient's sats were down in the 70's. Patient was on 15L high flow nasal cannula. RT called and patient placed on a non-rebreather. RN obtained an order for duoneb. Neb given and sats remained in low to mid 80's. RRT called. Patient not in any distress. Lung sounds diminished bilaterally. MD paged and orders received for CXR and ABG. Unable to maintain patient's sats > 88%. Order received to transfer patient to stepdown for Bipap.

## 2016-06-23 NOTE — Progress Notes (Signed)
Patient placed on HFNC post nebulizer tx. Patient on 100% FiO2 and 40L. O2 sats ranging 88-94%. BiPAP at bedside if needed. RT will continue to monitor patient.

## 2016-06-23 NOTE — Consult Note (Addendum)
PULMONARY / CRITICAL CARE MEDICINE   Name: Jeffrey Ochoa MRN: [id number] DOB: 1956/04/19    ADMISSION DATE:  06/16/2016 CONSULTATION DATE:  06/23/2016   REFERRING MD:   Dr Racheal Patches- Short  CHIEF COMPLAINT:  persistent Acute hypoxemic respiratory failure with infiltrates  brief 89 yeard old schizophrenic and DNR used to live under care of parents and since they are decased lives in assistend living. Prior melanoma to back, ongoing tobabcco abuse. Presented 06/16/2016 with 4 days of cough but without sick contact or myalgia or diarrhea or dysuria or blurred vision. Only some sputum but unclear if there was fever. CXR with bilateral dffisue ALI pattern (PCCM MD opinioin ) with LLL consolidation pattern. Since admit, HIV negative, urine leg, urine strep and flu PCR negaive. Rx with steroids, antibiotics and lasix and per RN atleast since 06/18/16 been in 15L [place] without improvement. CXR 06/23/2016 shows persistence of infiltrates since admit without change (last CXR 2015 that just showed hyperinflation). Afebrile throughgout staty. Therefore PCCM consuled. Steroids ongoing   11/18  RVP, autoimmune - pending  SUBJECTIVE/OVERNIGHT/INTERVAL HX 11/19 - went to floor and got worse and came back for bipap. He says bipap did not help. On high flow [place] 100% fio2 on 40L [place] ->  86%. He is DNR.  CT suggests viral pneumonitis patern with emphysema   VITAL SIGNS: BP 116/60   Pulse 81   Temp 97.5 F (36.4 C) (Axillary)   Resp (!) 24   Ht 6\' 7"  (2.007 m)   Wt 72.2 kg (159 lb 2.8 oz)   SpO2 92%   BMI 17.93 kg/m   HEMODYNAMICS:    VENTILATOR SETTINGS: FiO2 (%):  [50 %-100 %] 100 %  INTAKE / OUTPUT: I/O last 3 completed shifts: In: 440 [P.O.:240; Other:100; IV Piggyback:100] Out: 5375 [id number]  PHYSICAL EXAMINATION: General:  Frail male sitting on bed. Looks same like 11/18.No distress Neuro:  Alert and oriented x 3. Moves all4s. Just had breakfast HEENT:  No elevated JVP. No neck  nodes.  Cardiovascular:  Normal heart sounds Lungs:  Bilateral crackles. Only mild increased wob. On 40L [place] Abdomen:  Soft, non tender Musculoskeletal:  No edema Skin:  Is dry and intact  LABS:  PULMONARY  Recent Labs Lab 06/16/16 0930 06/18/16 0748 06/22/16 2225  PHART 7.409 7.392 7.446  PCO2ART 32.6 45.9 44.0  PO2ART 77.7* 72.6* 50.5*  HCO3 20.2 27.3 30.0*  O2SAT 94.1 93.6 83.8    CBC  Recent Labs Lab 06/20/16 0318 06/21/16 0303 06/23/16 0505  HGB 11.7* 12.1* 13.0  HCT 35.4* 36.5* 39.5  WBC 26.6* 25.3* 22.7*  PLT 178 159 170    COAGULATION  Recent Labs Lab 06/17/16 0332  INR 1.32    CARDIAC  No results for input(s): TROPONINI in the last 168 hours. No results for input(s): PROBNP in the last 168 hours.   CHEMISTRY  Recent Labs Lab 06/18/16 0740 06/19/16 0303 06/20/16 0318 06/21/16 0303 06/23/16 0505  NA 137 136 134* 138 136  K 3.6 3.4* 3.8 4.1 5.0  CL 102 98* 91* 97* 97*  CO2 27 32 34* 33* 32  GLUCOSE 126* 161* 201* 124* 124*  BUN 11 20 22* 17 19  CREATININE 0.58* 0.83 1.00 0.67 0.71  CALCIUM 7.8* 7.9* 8.1* 8.1* 8.1*  MG  --   --   --   --  2.4  PHOS  --   --   --   --  4.9*   Estimated Creatinine Clearance: 99 mL/min (by  C-G formula based on SCr of 0.71 mg/dL).   LIVER  Recent Labs Lab 06/17/16 0332 06/19/16 0303  AST 43* 27  ALT 33 27  ALKPHOS 56 72  BILITOT 0.9 0.7  PROT 4.9* 5.3*  ALBUMIN 2.3* 2.4*  INR 1.32  --      INFECTIOUS  Recent Labs Lab 06/22/16 0938 06/23/16 0505  PROCALCITON 0.13 <0.10     ENDOCRINE CBG (last 3)  No results for input(s): GLUCAP in the last 72 hours.       IMAGING x48h  - image(s) personally visualized  -   highlighted in bold Ct Chest High Resolution  Result Date: 06/22/2016 CLINICAL DATA:  Cough, fever, shortness of breath and leukocytosis. Acute respiratory failure. EXAM: CT CHEST WITHOUT CONTRAST TECHNIQUE: Multidetector CT imaging of the chest was performed following the  standard protocol without intravenous contrast. High resolution imaging of the lungs, as well as inspiratory and expiratory imaging, was performed. COMPARISON:  Chest radiographs dating back to 04/05/2014. FINDINGS: Cardiovascular: Coronary artery calcification. Heart size normal. No pericardial effusion. Mediastinum/Nodes: Mediastinal lymph nodes are not enlarged by CT size criteria. Hilar regions are difficult to definitively evaluate without IV contrast. No axillary adenopathy. Esophagus is grossly unremarkable. Lungs/Pleura: There is fairly diffuse ground-glass with areas of mild subpleural consolidation. Findings are superimposed on centrilobular and likely paraseptal emphysema. Difficult to exclude a 7 mm nodule in the posterior right lower lobe (series 9, image 129). Trace left pleural effusion. Trace pleural fluid posteromedially in the inferior right hemi thorax. Upper Abdomen: Low-attenuation lesions in the liver measure up to 1.5 cm and are likely cysts although definitive characterization is limited due to size and lack of postcontrast imaging. Cholecystectomy. Visualized portions of the adrenal glands, kidneys, spleen, pancreas, stomach and bowel are grossly unremarkable. Musculoskeletal: No worrisome lytic or sclerotic lesions. Degenerative changes are seen in the spine. IMPRESSION: 1. Pulmonary parenchymal pattern of ground-glass and scattered subpleural consolidation are likely acute given the clinical history, in which case, atypical or viral pneumonia is favored. 2. **An incidental finding of potential clinical significance has been found. Difficult to exclude a 7 mm right lower lobe nodule. Non-contrast chest CT at 6-12 months is recommended. If the nodule is stable at time of repeat CT, then future CT at 18-24 months (from today's scan) is considered optional for low-risk patients, but is recommended for high-risk patients. This recommendation follows the consensus statement: Guidelines for  Management of Incidental Pulmonary Nodules Detected on CT Images: From the Fleischner Society 2017; Radiology 2017; 284:228-243. ** 3. Trace bilateral pleural effusions, left greater than right. 4. Emphysema (ICD10-J43.9). 5. Aortic atherosclerosis (ICD10-170.0). Coronary artery calcification. Electronically Signed   By: Lorin Picket M.D.   On: 06/22/2016 15:42   Dg Chest Port 1 View  Result Date: 06/22/2016 CLINICAL DATA:  Dyspnea.  Hypertension. EXAM: PORTABLE CHEST 1 VIEW COMPARISON:  06/22/2016, 06/16/2016, 04/05/2014 FINDINGS: Airspace opacities persist bilaterally, greatest in the left lower lobe. No significant change from the recent prior studies. Multifocal pneumonia is a leading consideration. No large effusions. IMPRESSION: No significant change in the multifocal airspace opacities. Electronically Signed   By: Andreas Newport M.D.   On: 06/22/2016 22:43   Dg Chest Port 1 View  Result Date: 06/22/2016 CLINICAL DATA:  Hypoxia, pneumonia EXAM: PORTABLE CHEST 1 VIEW COMPARISON:  06/19/2016 FINDINGS: Diffuse bilateral airspace opacities are again noted, left greater than right. Heart is normal size. No visible effusions or pneumothorax. No acute bony abnormality. IMPRESSION: Stable diffuse bilateral airspace  opacities, left greater than right concerning for multifocal pneumonia. Electronically Signed   By: Rolm Baptise M.D.   On: 06/22/2016 07:49       ASSESSMENT / PLAN:  PULMONARY A: # Known tobacco abuse.  Last pre-admit  CXR 2015 with hyperinflation only Otherwise baseline status unknown  #currently Acute hypoxoemic resp failure with ALI pattern on CXR- unimproved since admit despite steroids, diuresis and antibiotics  Above presentation features are typical of respiratory virus related ALI, acute booop, idiopathich ALI  (former [place]), Acute neutrophilic/eosinophilic pneumonia, Undiagnosed IPF or HP   Flare, vasculitis, other alv hge (but hgb stable)   - 11./19 -  woprsening hypoxemia- bipap did not benefit subjectively. On 40L[place] 100% -> pulse ox 86% with easy desats with any movement. RVP and autoimune pending. DDx remains same    P:   Nocrtuirnal stable bipap QHS - I advise this to help with his wob long term Await echo result Await autoimmuen and vasculitis panel result Continue high flow o2 Continue steroid sand abx for now (check PCT and if normal dc abx) Meets indication for  bronch bal  V surgical lungbx - but too high risk  Course is severaldays to few weeks to improve with high mortality  Any worsening : will need morphine for dyspnea relief (ieither po scheduled or IV prn or IV gtt dependiong on his status)  Given DNR status, high risk for biopsy, and expectued course of days to weeks -> advise floor ./LTAC transfer   The patient is critically ill with multiple organ systems failure and requires high complexity decision making for assessment and support, frequent evaluation and titration of therapies, application of advanced monitoring technologies and extensive interpretation of multiple databases.   Critical Care Time devoted to patient care services described in this note is  30  Minutes. This time reflects time of care of this signee Dr Brand Males. This critical care time does not reflect procedure time, or teaching time or supervisory time of PA/NP/Med student/Med Resident etc but could involve care discussion time    Dr. Brand Males, M.D., [facility].C.P Pulmonary and Critical Care Medicine Staff Physician [place] [facility] Pager: [phone], If no answer or between  15:00h - 7:00h: call [phone]  06/23/2016 8:36 AM

## 2016-06-23 NOTE — Progress Notes (Signed)
[facility] Progress Note Patient Name: Jeffrey Ochoa DOB: [birth year] MRN: [id number]   Date of Service  06/23/2016  HPI/Events of Note  Camera check on patient transferred to ICU for worsening hypoxia and increased work of breathing. Patient currently on BiPAP. Breathing comfortably. No acute distress. Continued negative fluid status. On solu-medrol IV bid.   eICU Interventions  Continue BiPAP support.      Intervention Category Major Interventions: Respiratory failure - evaluation and management  Tera Partridge 06/23/2016, 12:13 AM

## 2016-06-23 NOTE — Significant Event (Signed)
Rapid Response Event Note  Overview: Time Called: 2132 Arrival Time: 2141 Event Type: Respiratory  Initial Focused Assessment: On arrival to patient's room (1438), he was in bed, alert and oriented x 4. I was told he just completed a breathing treatment. He was on non-reabreather mask, his Lung sounded diminished bilaterally. He was not in any obvious respiratory distress.  Interventions: X-ray, ABG ordered per MD request Plan of Care (if not transferred): Patient was change to heated high flow nasal cannular, with order to keep stats greater than or equal to 88%. RN to call if oxygen saturation drops below this range.  Event Summary: Name of Physician Notified: Fuller Plan, MD at 2142    at    Outcome: Stayed in room and stabalized  Event End Time: 2308  Jari Favre [place]

## 2016-06-24 DIAGNOSIS — R590 Localized enlarged lymph nodes: Secondary | ICD-10-CM

## 2016-06-24 DIAGNOSIS — L8990 Pressure ulcer of unspecified site, unspecified stage: Secondary | ICD-10-CM | POA: Insufficient documentation

## 2016-06-24 DIAGNOSIS — R911 Solitary pulmonary nodule: Secondary | ICD-10-CM

## 2016-06-24 DIAGNOSIS — J849 Interstitial pulmonary disease, unspecified: Secondary | ICD-10-CM

## 2016-06-24 LAB — PROCALCITONIN: Procalcitonin: <0.1 ng/mL

## 2016-06-24 LAB — PHOSPHORUS: PHOSPHORUS: 4.3 mg/dL (ref 2.5–4.6)

## 2016-06-24 LAB — MAGNESIUM: MAGNESIUM: 2.2 mg/dL (ref 1.7–2.4)

## 2016-06-24 MED ORDER — BUDESONIDE 0.25 MG/2ML IN SUSP: 0.2500 mg | Freq: Two times a day (BID) | RESPIRATORY_TRACT | 0 refills | Status: DC

## 2016-06-24 MED ORDER — SALINE SPRAY 0.65 % NA SOLN: 1.0000 | NASAL | 0 refills | Status: DC | PRN

## 2016-06-24 MED ORDER — IPRATROPIUM-ALBUTEROL 0.5-2.5 (3) MG/3ML IN SOLN: 3.0000 mL | RESPIRATORY_TRACT | 0 refills | Status: AC | PRN

## 2016-06-24 MED ORDER — ATORVASTATIN CALCIUM 40 MG PO TABS: 40.0000 mg | ORAL_TABLET | Freq: Every day | ORAL | 0 refills | Status: AC

## 2016-06-24 MED ORDER — NICOTINE 21 MG/24HR TD PT24: 21.0000 mg | MEDICATED_PATCH | Freq: Every day | TRANSDERMAL | 0 refills | Status: AC

## 2016-06-24 MED ORDER — METHYLPREDNISOLONE SODIUM SUCC 125 MG IJ SOLR: 60.0000 mg | Freq: Two times a day (BID) | INTRAMUSCULAR | 0 refills | Status: DC

## 2016-06-24 MED ORDER — ORAL CARE MOUTH RINSE: 15.0000 mL | Freq: Two times a day (BID) | OROMUCOSAL | 0 refills | Status: DC

## 2016-06-24 MED ORDER — CYCLOBENZAPRINE HCL 5 MG PO TABS: 5.0000 mg | ORAL_TABLET | Freq: Three times a day (TID) | ORAL | 0 refills | Status: DC | PRN

## 2016-06-24 MED ORDER — BISACODYL 10 MG RE SUPP: 10.0000 mg | Freq: Every day | RECTAL | 0 refills | Status: DC | PRN

## 2016-06-24 MED ORDER — ALBUTEROL SULFATE (2.5 MG/3ML) 0.083% IN NEBU: 2.5000 mg | INHALATION_SOLUTION | Freq: Three times a day (TID) | RESPIRATORY_TRACT | 0 refills | Status: DC

## 2016-06-24 MED ORDER — POLYETHYLENE GLYCOL 3350 17 G PO PACK: 17.0000 g | PACK | Freq: Every day | ORAL | 0 refills | Status: AC

## 2016-06-24 MED ORDER — CLONAZEPAM 1 MG PO TABS: 1.0000 mg | ORAL_TABLET | Freq: Two times a day (BID) | ORAL | 0 refills | Status: AC

## 2016-06-24 MED ORDER — ACETAMINOPHEN 325 MG PO TABS
650.0000 mg | ORAL_TABLET | ORAL | Status: DC | PRN
  Administered 2016-06-24: 650 mg via ORAL
  Filled 2016-06-24: qty 2

## 2016-06-24 MED ORDER — SENNA 8.6 MG PO TABS: 2.0000 | ORAL_TABLET | Freq: Every day | ORAL | 0 refills | Status: AC

## 2016-06-24 MED ORDER — ASPIRIN 81 MG PO TBEC: 81.0000 mg | DELAYED_RELEASE_TABLET | Freq: Every day | ORAL | 0 refills | Status: AC

## 2016-06-24 MED ORDER — OXYMETAZOLINE HCL 0.05 % NA SOLN: 1.0000 | Freq: Two times a day (BID) | NASAL | 0 refills | Status: DC | PRN

## 2016-06-24 NOTE — Progress Notes (Signed)
PROGRESS NOTE  Jeffrey Ochoa  [id number] DOB: [birth year] DOA: 06/16/2016 PCP: Penni Homans, MD  Brief Narrative:  60 y/o male heavy chronic smoker presented from ALF with progressive SOB, cough, fever and found in ED to have multifocal pneumonia, requiring bipap therapy, also has hyponatremia.  Pt has paranoid schizophrenia.  Patient has been slow to recover despite antibiotics and steroids and diuresis.  Pulmonology was consulted for consideration of noninfectious etiologies of pulmonary infiltrates and respiratory failure.    Assessment & Plan:   Active Problems:   Schizophrenia, paranoid ([place])   Tobacco use disorder   HTN (hypertension)   Respiratory failure with hypoxia (HCC)   Multifocal pneumonia   Leukocytosis   COPD exacerbation (HCC)   Pressure injury of skin  Acute respiratory failure with hypoxia initially attributed to multifocal pneumonia and acute COPD exacerbation, however, he continued to have high oxygen requirements.  Attempted diuresis with minimal improvement in O2 levels.  HIV negative.  CT demonstrates mixed ground glass and and subpleural consolidation.  Concern for autoimmune process.   -  Appreciate Pulmonary assistance   -  Wean high flow nasal cannula as tolerated -  HIV neg -  Legionella neg and s. pneumo neg -  Continue steroids  -  Continue duonebs -  Anti-GBM negative, ESR 22, anti-CCP negative, ANA negative.  RF and ANCA pending.  -  Anti-smith, anti-DNA, anti-histone, and complement levels added today -  Respiratory viral panel negative -  ECHO:  Preserved EF, grade 1 DD  13m RLL lung nodule and subcarinal LN -  Repeat CT chest in 3 months  Hypovolemic hyponatremia, resolved with IVF  Leukocytosis due to steroids and pneumonia, trending down slightly -  Continue steroids at current dose   CAD per CT, asymptomatic.  Started aspirin and statin.  Already on beta blocker Hypokalemia, continue oral potassium supplementation Paranoid  schizophrenia, currently stable, continue home medications.  Needs prolixin infusion 11/22 Essential hypertension, BP stable, continue home medications Elevated LFTs, resolved with IVF  Normocytic anemia, resolving.     DVT prophylaxis:  lovenox Code Status:  DNR/DNI Family Communication:  Patient alone Disposition Plan:   To LTACH.  Case management consult placed   Consultants:   Pulmonology  Procedures:  none  Antimicrobials:  Anti-infectives    Start     Dose/Rate Route Frequency Ordered Stop   06/19/16 0800  azithromycin (ZITHROMAX) tablet 500 mg     500 mg Oral Daily 06/19/16 0714 06/21/16 0922   06/18/16 0900  vancomycin (VANCOCIN) IVPB 1000 mg/200 mL premix  Status:  Discontinued     1,000 mg 200 mL/hr over 60 Minutes Intravenous Every 8 hours 06/18/16 0803 06/20/16 0815   06/18/16 0900  piperacillin-tazobactam (ZOSYN) IVPB 3.375 g  Status:  Discontinued     3.375 g 12.5 mL/hr over 240 Minutes Intravenous Every 8 hours 06/18/16 0803 06/22/16 1144   06/17/16 0600  levofloxacin (LEVAQUIN) IVPB 750 mg  Status:  Discontinued     750 mg 100 mL/hr over 90 Minutes Intravenous Every 24 hours 06/16/16 1114 06/18/16 0752   06/16/16 1115  levofloxacin (LEVAQUIN) IVPB 750 mg  Status:  Discontinued     750 mg 100 mL/hr over 90 Minutes Intravenous Every 24 hours 06/16/16 1114 06/16/16 1116   06/16/16 0430  levofloxacin (LEVAQUIN) IVPB 750 mg     750 mg 100 mL/hr over 90 Minutes Intravenous  Once 06/16/16 0422 06/16/16 0617         Subjective:  O2  sat in mid-80s overnight while asleep.  Feels the same as yesterday.  Really does not like the bipap mask.    Objective: Vitals:   06/24/16 0320 06/24/16 0553 06/24/16 0805 06/24/16 0815  BP:  100/85    Pulse: 76 76    Resp: (!) 22 (!) 22    Temp:  97.4 F (36.3 C)    TempSrc:  Oral    SpO2: 96% 96% 97% 92%  Weight:  72 kg (158 lb 11.7 oz)    Height:        Intake/Output Summary (Last 24 hours) at 06/24/16 1319 Last  data filed at 06/24/16 1232  Gross per 24 hour  Intake              240 ml  Output             3120 ml  Net            -2880 ml   Filed Weights   06/22/16 0600 06/23/16 0000 06/24/16 0553  Weight: 72.1 kg (158 lb 15.2 oz) 72.2 kg (159 lb 2.8 oz) 72 kg (158 lb 11.7 oz)    Examination:  General exam:  Adult male, thin, mild tachypnea at rest. HEENT:  NCAT, MMM Respiratory system:  Stable fine rales at the left base, no rhonchi.  No wheeze Cardiovascular system: Regular rate and rhythm, normal S1/S2. No murmurs, rubs, gallops or clicks.  Warm extremities Gastrointestinal system: Normal active bowel sounds, soft, nondistended, nontender. MSK:  Normal tone and bulk, no lower extremity edema Neuro:  Grossly intact    Data Reviewed: I have personally reviewed following labs and imaging studies  CBC:  Recent Labs Lab 06/18/16 0740 06/19/16 0303 06/20/16 0318 06/21/16 0303 06/23/16 0505  WBC 26.9* 22.6* 26.6* 25.3* 22.7*  NEUTROABS  --  21.5*  --   --   --   HGB 11.0* 10.2* 11.7* 12.1* 13.0  HCT 33.5* 30.2* 35.4* 36.5* 39.5  MCV 94.6 91.8 95.2 95.3 95.6  PLT 225 162 178 159 170   Basic Metabolic Panel:  Recent Labs Lab 06/18/16 0740 06/19/16 0303 06/20/16 0318 06/21/16 0303 06/23/16 0505 06/24/16 0435  NA 137 136 134* 138 136  --   K 3.6 3.4* 3.8 4.1 5.0  --   CL 102 98* 91* 97* 97*  --   CO2 27 32 34* 33* 32  --   GLUCOSE 126* 161* 201* 124* 124*  --   BUN 11 20 22* 17 19  --   CREATININE 0.58* 0.83 1.00 0.67 0.71  --   CALCIUM 7.8* 7.9* 8.1* 8.1* 8.1*  --   MG  --   --   --   --  2.4 2.2  PHOS  --   --   --   --  4.9* 4.3   GFR: Estimated Creatinine Clearance: 98.8 mL/min (by C-G formula based on SCr of 0.71 mg/dL). Liver Function Tests:  Recent Labs Lab 06/19/16 0303  AST 27  ALT 27  ALKPHOS 72  BILITOT 0.7  PROT 5.3*  ALBUMIN 2.4*   No results for input(s): LIPASE, AMYLASE in the last 168 hours. No results for input(s): AMMONIA in the last 168  hours. Coagulation Profile: No results for input(s): INR, PROTIME in the last 168 hours. Cardiac Enzymes: No results for input(s): CKTOTAL, CKMB, CKMBINDEX, TROPONINI in the last 168 hours. BNP (last 3 results) No results for input(s): PROBNP in the last 8760 hours. HbA1C: No results for input(s): HGBA1C in   the last 72 hours. CBG: No results for input(s): GLUCAP in the last 168 hours. Lipid Profile: No results for input(s): CHOL, HDL, LDLCALC, TRIG, CHOLHDL, LDLDIRECT in the last 72 hours. Thyroid Function Tests: No results for input(s): TSH, T4TOTAL, FREET4, T3FREE, THYROIDAB in the last 72 hours. Anemia Panel: No results for input(s): VITAMINB12, FOLATE, FERRITIN, TIBC, IRON, RETICCTPCT in the last 72 hours. Urine analysis:    Component Value Date/Time   COLORURINE YELLOW 06/16/2016 0548   APPEARANCEUR CLEAR 06/16/2016 0548   LABSPEC 1.009 06/16/2016 0548   PHURINE 6.5 06/16/2016 0548   GLUCOSEU NEGATIVE 06/16/2016 0548   HGBUR LARGE (A) 06/16/2016 0548   BILIRUBINUR NEGATIVE 06/16/2016 0548   KETONESUR NEGATIVE 06/16/2016 0548   PROTEINUR NEGATIVE 06/16/2016 0548   NITRITE NEGATIVE 06/16/2016 0548   LEUKOCYTESUR NEGATIVE 06/16/2016 0548   Sepsis Labs: _0 (procalcitonin:4,lacticidven:4)  ) Recent Results (from the past 240 hour(s))  Blood Culture (routine x 2)     Status: None   Collection Time: 06/16/16  4:32 AM  Result Value Ref Range Status   Specimen Description BLOOD LEFT FOREARM  Final   Special Requests BOTTLES DRAWN AEROBIC AND ANAEROBIC 5ML  Final   Culture   Final    NO GROWTH 5 DAYS Performed at [facility]    Report Status 06/21/2016 FINAL  Final  Blood Culture (routine x 2)     Status: None   Collection Time: 06/16/16  4:32 AM  Result Value Ref Range Status   Specimen Description BLOOD LEFT FOREARM LOWER  Final   Special Requests BOTTLES DRAWN AEROBIC AND ANAEROBIC 5ML  Final   Culture   Final    NO GROWTH 5 DAYS Performed at [facility]    Report Status 06/21/2016 FINAL  Final  Urine culture     Status: None   Collection Time: 06/16/16  5:48 AM  Result Value Ref Range Status   Specimen Description URINE, CLEAN CATCH  Final   Special Requests NONE  Final   Culture NO GROWTH Performed at [facility]   Final   Report Status 06/17/2016 FINAL  Final  MRSA PCR Screening     Status: None   Collection Time: 06/16/16  8:01 AM  Result Value Ref Range Status   MRSA by PCR NEGATIVE NEGATIVE Final    Comment:        The GeneXpert MRSA Assay (FDA approved for NASAL specimens only), is one component of a comprehensive MRSA colonization surveillance program. It is not intended to diagnose MRSA infection nor to guide or monitor treatment for MRSA infections.   Respiratory Panel by PCR     Status: None   Collection Time: 06/22/16 12:13 PM  Result Value Ref Range Status   Adenovirus NOT DETECTED NOT DETECTED Final   Coronavirus 229E NOT DETECTED NOT DETECTED Final   Coronavirus HKU1 NOT DETECTED NOT DETECTED Final   Coronavirus NL63 NOT DETECTED NOT DETECTED Final   Coronavirus OC43 NOT DETECTED NOT DETECTED Final   Metapneumovirus NOT DETECTED NOT DETECTED Final   Rhinovirus / Enterovirus NOT DETECTED NOT DETECTED Final   Influenza A NOT DETECTED NOT DETECTED Final   Influenza B NOT DETECTED NOT DETECTED Final   Parainfluenza Virus 1 NOT DETECTED NOT DETECTED Final   Parainfluenza Virus 2 NOT DETECTED NOT DETECTED Final   Parainfluenza Virus 3 NOT DETECTED NOT DETECTED Final   Parainfluenza Virus 4 NOT DETECTED NOT DETECTED Final   Respiratory Syncytial Virus NOT DETECTED NOT DETECTED Final  Bordetella pertussis NOT DETECTED NOT DETECTED Final   Chlamydophila pneumoniae NOT DETECTED NOT DETECTED Final   Mycoplasma pneumoniae NOT DETECTED NOT DETECTED Final    Comment: Performed at [facility]      Radiology Studies: Dg Chest Port 1 View  Result Date: 06/22/2016 CLINICAL DATA:   Dyspnea.  Hypertension. EXAM: PORTABLE CHEST 1 VIEW COMPARISON:  06/22/2016, 06/16/2016, 04/05/2014 FINDINGS: Airspace opacities persist bilaterally, greatest in the left lower lobe. No significant change from the recent prior studies. Multifocal pneumonia is a leading consideration. No large effusions. IMPRESSION: No significant change in the multifocal airspace opacities. Electronically Signed   By: Andreas Newport M.D.   On: 06/22/2016 22:43     Scheduled Meds: . albuterol  2.5 mg Nebulization TID  . aspirin EC  81 mg Oral Daily  . atenolol  25 mg Oral BID  . atorvastatin  40 mg Oral q1800  . benztropine  2 mg Oral Daily  . budesonide (PULMICORT) nebulizer solution  0.25 mg Nebulization BID  . busPIRone  10 mg Oral TID  . clonazePAM  1 mg Oral BID  . enoxaparin (LOVENOX) injection  40 mg Subcutaneous Q24H  . [START ON 06/26/2016] fluPHENAZine decanoate  75 mg Intramuscular Q14 Days  . mouth rinse  15 mL Mouth Rinse BID  . methylPREDNISolone (SOLU-MEDROL) injection  60 mg Intravenous BID  . nicotine  21 mg Transdermal Daily  . polyethylene glycol  17 g Oral Daily  . senna  2 tablet Oral QHS  . sertraline  100 mg Oral BID  . sodium chloride flush  3 mL Intravenous Q12H  . tiotropium  18 mcg Inhalation Daily   Continuous Infusions:   LOS: 8 days    Time spent: 30 min    Janece Canterbury, MD [facility] Pager [phone]  If 7PM-7AM, please contact night-coverage [email or web address] Password [email or web address] 06/24/2016, 1:19 PM

## 2016-06-24 NOTE — Progress Notes (Addendum)
PULMONARY / CRITICAL CARE MEDICINE   Name: Jeffrey Ochoa MRN: [id number] DOB: 10/26/1955    ADMISSION DATE:  06/16/2016 CONSULTATION DATE:  06/24/2016   REFERRING MD:   Dr Racheal Patches- Short  CHIEF COMPLAINT:  persistent Acute hypoxemic respiratory failure with infiltrates  BRIEF:  60 y.o. schizophrenic and DNR used to live under care of parents and since they are decased lives in assistend living. Prior melanoma to back, ongoing tobabcco abuse. Presented 06/16/2016 with 4 days of cough but without sick contact or myalgia or diarrhea or dysuria or blurred vision. Only some sputum but unclear if there was fever. CXR with bilateral dffisue ALI pattern (PCCM MD opinioin ) with LLL consolidation pattern. Since admit, HIV negative, urine leg, urine strep and flu PCR negaive. Rx with steroids, antibiotics and lasix and per RN atleast since 06/18/16 been in 15L  without improvement. CXR 06/24/2016 shows persistence of infiltrates since admit without change (last CXR 2015 that just showed hyperinflation). Afebrile throughgout staty. Therefore PCCM consuled. Steroids ongoing  SUBJECTIVE: Patient denies any worsening in his breathing. Denies any new chest pain or pressure. Denies any productive cough.  REVIEW OF SYSTEMS: No subjective fever, chills, or sweats. No nausea or vomiting. No joint pain or swelling.  VITAL SIGNS: BP 100/85 (BP Location: Left Arm)   Pulse 76   Temp 97.4 F (36.3 C) (Oral)   Resp (!) 22   Ht 6\' 7"  (2.007 m)   Wt 158 lb 11.7 oz (72 kg)   SpO2 92%   BMI 17.88 kg/m   HEMODYNAMICS:    VENTILATOR SETTINGS: FiO2 (%):  [90 %-100 %] 90 %  INTAKE / OUTPUT: I/O last 3 completed shifts: In: 480 [P.O.:480] Out: 3395 [Urine:3395]  PHYSICAL EXAMINATION: General:  Thin Caucasian male. Sleepy until awoken. No distress.  Integument:  Warm & dry. No rash on exposed skin.  HEENT: Dry mucus membranes. No oral ulcers. No scleral injection or icterus.  Cardiovascular:  Regular  rate. No edema. No appreciable JVD.  Pulmonary:  Normal work of breathing on high flow nasal cannula oxygen. The patchy bilateral crackles on inspiration. Abdomen: Soft. Normal bowel sounds. Nondistended.  Musculoskeletal:  Normal bulk and tone. Hand grip strength 5/5 bilaterally. No joint deformity or effusion appreciated.  LABS:  PULMONARY  Recent Labs Lab 06/18/16 0748 06/22/16 2225  PHART 7.392 7.446  PCO2ART 45.9 44.0  PO2ART 72.6* 50.5*  HCO3 27.3 30.0*  O2SAT 93.6 83.8    CBC  Recent Labs Lab 06/20/16 0318 06/21/16 0303 06/23/16 0505  HGB 11.7* 12.1* 13.0  HCT 35.4* 36.5* 39.5  WBC 26.6* 25.3* 22.7*  PLT 178 159 170    COAGULATION No results for input(s): INR in the last 168 hours.  CARDIAC  No results for input(s): TROPONINI in the last 168 hours. No results for input(s): PROBNP in the last 168 hours.   CHEMISTRY  Recent Labs Lab 06/18/16 0740 06/19/16 0303 06/20/16 0318 06/21/16 0303 06/23/16 0505 06/24/16 0435  NA 137 136 134* 138 136  --   K 3.6 3.4* 3.8 4.1 5.0  --   CL 102 98* 91* 97* 97*  --   CO2 27 32 34* 33* 32  --   GLUCOSE 126* 161* 201* 124* 124*  --   BUN 11 20 22* 17 19  --   CREATININE 0.58* 0.83 1.00 0.67 0.71  --   CALCIUM 7.8* 7.9* 8.1* 8.1* 8.1*  --   MG  --   --   --   --  2.4 2.2  PHOS  --   --   --   --  4.9* 4.3   Estimated Creatinine Clearance: 98.8 mL/min (by C-G formula based on SCr of 0.71 mg/dL).   LIVER  Recent Labs Lab 06/19/16 0303  AST 27  ALT 27  ALKPHOS 72  BILITOT 0.7  PROT 5.3*  ALBUMIN 2.4*     INFECTIOUS  Recent Labs Lab 06/22/16 0938 06/23/16 0505 06/24/16 0435  PROCALCITON 0.13 <0.10 <0.10     ENDOCRINE CBG (last 3)  No results for input(s): GLUCAP in the last 72 hours.       IMAGING x48h  - image(s) personally visualized  -   highlighted in bold Dg Chest Port 1 View  Result Date: 06/22/2016 CLINICAL DATA:  Dyspnea.  Hypertension. EXAM: PORTABLE CHEST 1 VIEW  COMPARISON:  06/22/2016, 06/16/2016, 04/05/2014 FINDINGS: Airspace opacities persist bilaterally, greatest in the left lower lobe. No significant change from the recent prior studies. Multifocal pneumonia is a leading consideration. No large effusions. IMPRESSION: No significant change in the multifocal airspace opacities. Electronically Signed   By: Andreas Newport M.D.   On: 06/22/2016 22:43   STUDIES: HRCT CHEST W/O 11/18:  Personally reviewed by me. No obvious cranial to caudal prevalence of patchy bilateral groundglass opacities with some associated bronchiectasis suggesting traction bronchiectasis. Patchy intralobular septal thickening bilaterally consistent with fibrosis. Certainly findings are more prevalent in the lower lung zones. 7 mm right lower lobe noduledue to his and posterior segment. No definitive honeycombing. Questionable tiny left pleural effusion versus pleural thickening. Radiology commented on right pleural effusion but I did not appreciate this. Subcarinal lymph node measuring 1.4 cm in short axis but no other pathologically enlarged mediastinal adenopathy. No pericardial effusion. TTE 11/18: Moderate LVH with grade 1 diastolic dysfunction. Normal regional wall motion. EF 60-65%. LA & RA normal in size. RV normal in size and function. No aortic stenosis or regurgitation. Aortic root normal in size. No mitral stenosis or regurgitation. No significant pulmonic regurgitation. No significant tricuspid regurgitation. No pericardial effusion.  MICROBIOLOGY: Blood Ctx x2 11/12:  Negative Urine Ctx 11/12:  Negative MRSA PCR 11/12:  Negative  Urine Strep Ag 11/15:  Negative Urine Legionella Ag 11/15:  Negative  HIV 11/16:  Nonreactive Respiratory Viral Panel PCR 11/18:  Negative   ANTIBIOTICS: Azithromycin 11/15 - 11/17 Levaquin 11/12 - 11/14 Zosyn 11/14 - 11/18 Vancomycin 11/14 - 11/16  SEROLOGIES: Anti-CCP:  5 Anti-GBM:  Negative  RF >> ANA >> MPO/PR3 >>  ASSESSMENT /  PLAN:  60 y.o. male with acute hypoxic respiratory failure and pattern on imaging suggestive of acute pneumonitis. Infection workup has been negative and I doubt this is due to an acute infectious illness. I am highly suspicious about an autoimmune process without exposure to birds, mold, or asbestos. The pattern is not consistent with a typical UIP pattern that would be indicative of idiopathic pulmonary fibrosis but this still must be considered as an atypical pattern possibility with his age and demographics. With his chronic and long history of IM Prolixin use. I am concerned about lupus as a cause for not only the pleural thickening on his left but also his fibrotic picture.  1. Acute Hypoxic Respiratory Failure: Likely secondary to acute pneumonitis. Pattern could be consistent with atypical UIP versus acute/subacute hypersensitivity pneumonitis versus fibrosing NSIP. Serologies pending. Recommend continuing to wean FiO2 for saturation greater than 90%. 2. Possible Acute Pneumonitis/ILD:  Suggested by groundglass opacities on high-resolution CT imaging. Infectious workup unrevealing.  Suspect secondary to autoimmune process. Sending serologies for lupus give on chronic use of Prolixin. Continuing Solu-Medrol 60 mg IV every 12 hours. 3. Tiny Right Pleural Effusion vs Pleural Thickening:  Likely secondary to autoimmune process. Workup pending. 4. Right Lower Lobe Nodule:  Possibly 58mm but difficult to see. Patient will need repeat CT imaging of the chest without contrast in 3 months per guidelines depending upon clinical progress. 5. Subcarinal Mediastinal Adenopathy:  Likely reactive. Plan to follow with repeat CT imaging of the chest as the size of his right lower lobe nodule is trended. 6. Emphysema:  Mild on my review. Likely airway dilation with traction.  Sonia Baller Ashok Cordia, M.D. [facility] Pulmonary [facility] Critical [facility] Pager:  [phone] After 3pm or if no response, call [phone] 11:14 AM  06/24/16

## 2016-06-24 NOTE — Progress Notes (Signed)
Spoke with pt concerning discharge to [facility], pt agreed. Referral given to in house rep with [facility] [phone]).  Pt have a bed for today.

## 2016-06-24 NOTE — Progress Notes (Addendum)
[facility] unable to transfer patient with high flow 02 at 30 LPM, 90% Fi02.  Patient to transfer to ICU to be on bipap for 30 minutes; once stable for 30 minutes, [facility] will be able to transfer patient to [facility] [place].  Dr. Sheran Fava, RT, ICU RN all aware, transfer of patient has been performed and he was stable at this time. Report was given previously to [place], Therapist, sports at [facility] at 1430.

## 2016-06-24 NOTE — Progress Notes (Addendum)
Pt being transferred to [facility] by [facility].  Report previously called with an update to [facility] Rn.  Report given to [facility] over the phone and at bedside.  VSS.  No distress.  Pt will transport with NRB mask and a 20G PIV in the Right forearm.  All belongings sent with pt.

## 2016-06-24 NOTE — Discharge Summary (Signed)
Physician Discharge Summary  Jeffrey Ochoa VWU:[id number] DOB: 02-Dec-1955 DOA: 06/16/2016  PCP: Penni Homans, MD  Admit date: 06/16/2016 Transfer date: 06/24/2016  Admitted From: home  Disposition:  Transfer to [facility]  Recommendations for Follow-up:  1. Please follow up on the following pending results:  RF, ANCA, Anti-smith, anti-DNA, anti-histone, and complement levels  2. Continue physical therapy 3. Needs next prolixin injection on 11/22 4. Nutrition consultation  Equipment/Devices:  Currently on HFNC 30L/min, 90% FIO2  Discharge Condition:  Stable, improved CODE STATUS:  DNR  Diet recommendation:  regular   Brief/Interim Summary:  60 y/o male heavy chronic smoker presented from ALF with progressive SOB, cough, fever and found in ED to have multifocal pneumonia requiring bipap therapy and hyponatremia. Pt has paranoid schizophrenia. He was slow to recover despite antibiotics, steroids and diuresis.  Pulmonology was consulted for consideration of noninfectious etiologies.  Suspect autoimmune process and work up is pending.  Antibiotics have been discontinued, but he remains on steroids.        Discharge Diagnoses:  Active Problems:   Schizophrenia, paranoid ([place])   Tobacco use disorder   HTN (hypertension)   Respiratory failure with hypoxia (HCC)   Multifocal pneumonia   Leukocytosis   COPD exacerbation (HCC)   Pressure injury of skin  Acute respiratory failure with hypoxia initially attributed to multifocal pneumonia and acute COPD exacerbation, however, he continued to have high oxygen requirements.  Attempted diuresis with minimal improvement in O2 levels.  HIV negative.  CT demonstrated mixed ground glass and and subpleural consolidation.  Concern for autoimmune process.   -  Appreciate Pulmonary assistance   -  Wean high flow nasal cannula as tolerated -  HIV neg -  Legionella neg and s. pneumo neg -  Continue solumedrol 56m IV BID -  Continue duonebs and  albuterol -  Anti-GBM negative, ESR 22, anti-CCP negative, ANA negative.  RF and ANCA pending.  -  Anti-smith, anti-DNA, anti-histone, and complement levels ordered on 11/20 -  Respiratory viral panel negative -  ECHO:  Preserved EF, grade 1 DD  [id number] lung nodule and subcarinal LN -  Repeat CT chest in 3 months  Hypovolemic hyponatremia, resolved with IVF  Leukocytosis due to steroids and pneumonia, trending down slightly -  Continue steroids at current dose   CAD per CT, asymptomatic.  Started aspirin and statin.  Already on beta blocker Hypokalemia, continue oral potassium supplementation Paranoid schizophrenia, currently stable, continue home medications.  Needs prolixin infusion 11/22 Essential hypertension, BP stable, continue home medications Elevated LFTs, resolved with IVF  Normocytic anemia, resolving.     DVT prophylaxis:  lovenox Code Status:  DNR/DNI Family Communication:  Patient alone Disposition Plan:   To LTACH.  Case management consult placed   Discharge Instructions  Discharge Instructions    Diet general    Complete by:  As directed        Medication List    STOP taking these medications   acetaminophen 500 MG tablet Commonly known as:  TYLENOL   donepezil 5 MG tablet Commonly known as:  ARICEPT     TAKE these medications   albuterol (2.5 MG/3ML) 0.083% nebulizer solution Commonly known as:  PROVENTIL Take 3 mLs (2.5 mg total) by nebulization 3 (three) times daily.   aspirin 81 MG EC tablet Take 1 tablet (81 mg total) by mouth daily. Start taking on:  06/25/2016   atenolol 25 MG tablet Commonly known as:  TENORMIN Take 25 mg by mouth  2 (two) times daily.   atorvastatin 40 MG tablet Commonly known as:  LIPITOR Take 1 tablet (40 mg total) by mouth daily at 6 PM.   benztropine 2 MG tablet Commonly known as:  COGENTIN Take 2 mg by mouth 2 (two) times daily.   bisacodyl 10 MG suppository Commonly known as:  DULCOLAX Place 1  suppository (10 mg total) rectally daily as needed for mild constipation or moderate constipation.   budesonide 0.25 MG/2ML nebulizer solution Commonly known as:  PULMICORT Take 2 mLs (0.25 mg total) by nebulization 2 (two) times daily.   busPIRone 10 MG tablet Commonly known as:  BUSPAR Take 10 mg by mouth 3 (three) times daily.   clonazePAM 1 MG tablet Commonly known as:  KLONOPIN Take 1 tablet (1 mg total) by mouth 2 (two) times daily. What changed:  how much to take  when to take this  reasons to take this   cyclobenzaprine 5 MG tablet Commonly known as:  FLEXERIL Take 1 tablet (5 mg total) by mouth 3 (three) times daily as needed for muscle spasms.   fluPHENAZine decanoate 25 MG/ML injection Commonly known as:  PROLIXIN Inject 75 mg into the muscle every 14 (fourteen) days.   ipratropium-albuterol 0.5-2.5 (3) MG/3ML Soln Commonly known as:  DUONEB Take 3 mLs by nebulization every 2 (two) hours as needed.   methylPREDNISolone sodium succinate 125 mg/2 mL injection Commonly known as:  SOLU-MEDROL Inject 0.96 mLs (60 mg total) into the vein 2 (two) times daily.   mouth rinse Liqd solution 15 mLs by Mouth Rinse route 2 (two) times daily.   nicotine 21 mg/24hr patch Commonly known as:  NICODERM CQ - dosed in mg/24 hours Place 1 patch (21 mg total) onto the skin daily. Start taking on:  06/25/2016   oxymetazoline 0.05 % nasal spray Commonly known as:  AFRIN Place 1 spray into both nostrils 2 (two) times daily as needed for congestion.   polyethylene glycol packet Commonly known as:  MIRALAX / GLYCOLAX Take 17 g by mouth daily. Start taking on:  06/25/2016   senna 8.6 MG Tabs tablet Commonly known as:  SENOKOT Take 2 tablets (17.2 mg total) by mouth at bedtime.   sertraline 50 MG tablet Commonly known as:  ZOLOFT Take 100 mg by mouth 2 (two) times daily.   sodium chloride 0.65 % Soln nasal spray Commonly known as:  OCEAN Place 1 spray into both nostrils  as needed for congestion.   tiotropium 18 MCG inhalation capsule Commonly known as:  SPIRIVA Place 18 mcg into inhaler and inhale daily.       Allergies  Allergen Reactions  . Risperidone And Related Other (See Comments)    Makes him Manic     Consultations: Pulmonology   Procedures/Studies: Ct Chest High Resolution  Result Date: 06/22/2016 CLINICAL DATA:  Cough, fever, shortness of breath and leukocytosis. Acute respiratory failure. EXAM: CT CHEST WITHOUT CONTRAST TECHNIQUE: Multidetector CT imaging of the chest was performed following the standard protocol without intravenous contrast. High resolution imaging of the lungs, as well as inspiratory and expiratory imaging, was performed. COMPARISON:  Chest radiographs dating back to 04/05/2014. FINDINGS: Cardiovascular: Coronary artery calcification. Heart size normal. No pericardial effusion. Mediastinum/Nodes: Mediastinal lymph nodes are not enlarged by CT size criteria. Hilar regions are difficult to definitively evaluate without IV contrast. No axillary adenopathy. Esophagus is grossly unremarkable. Lungs/Pleura: There is fairly diffuse ground-glass with areas of mild subpleural consolidation. Findings are superimposed on centrilobular and likely paraseptal  emphysema. Difficult to exclude a 7 mm nodule in the posterior right lower lobe (series 9, image 129). Trace left pleural effusion. Trace pleural fluid posteromedially in the inferior right hemi thorax. Upper Abdomen: Low-attenuation lesions in the liver measure up to 1.5 cm and are likely cysts although definitive characterization is limited due to size and lack of postcontrast imaging. Cholecystectomy. Visualized portions of the adrenal glands, kidneys, spleen, pancreas, stomach and bowel are grossly unremarkable. Musculoskeletal: No worrisome lytic or sclerotic lesions. Degenerative changes are seen in the spine. IMPRESSION: 1. Pulmonary parenchymal pattern of ground-glass and  scattered subpleural consolidation are likely acute given the clinical history, in which case, atypical or viral pneumonia is favored. 2. **An incidental finding of potential clinical significance has been found. Difficult to exclude a 7 mm right lower lobe nodule. Non-contrast chest CT at 6-12 months is recommended. If the nodule is stable at time of repeat CT, then future CT at 18-24 months (from today's scan) is considered optional for low-risk patients, but is recommended for high-risk patients. This recommendation follows the consensus statement: Guidelines for Management of Incidental Pulmonary Nodules Detected on CT Images: From the Fleischner Society 2017; Radiology 2017; 284:228-243. ** 3. Trace bilateral pleural effusions, left greater than right. 4. Emphysema (ICD10-J43.9). 5. Aortic atherosclerosis (ICD10-170.0). Coronary artery calcification. Electronically Signed   By: Leanna Battles M.D.   On: 06/22/2016 15:42   Dg Chest Port 1 View  Result Date: 06/22/2016 CLINICAL DATA:  Dyspnea.  Hypertension. EXAM: PORTABLE CHEST 1 VIEW COMPARISON:  06/22/2016, 06/16/2016, 04/05/2014 FINDINGS: Airspace opacities persist bilaterally, greatest in the left lower lobe. No significant change from the recent prior studies. Multifocal pneumonia is a leading consideration. No large effusions. IMPRESSION: No significant change in the multifocal airspace opacities. Electronically Signed   By: Ellery Plunk M.D.   On: 06/22/2016 22:43   Dg Chest Port 1 View  Result Date: 06/22/2016 CLINICAL DATA:  Hypoxia, pneumonia EXAM: PORTABLE CHEST 1 VIEW COMPARISON:  06/19/2016 FINDINGS: Diffuse bilateral airspace opacities are again noted, left greater than right. Heart is normal size. No visible effusions or pneumothorax. No acute bony abnormality. IMPRESSION: Stable diffuse bilateral airspace opacities, left greater than right concerning for multifocal pneumonia. Electronically Signed   By: Charlett Nose M.D.   On:  06/22/2016 07:49   Dg Chest Port 1 View  Result Date: 06/19/2016 CLINICAL DATA:  Pneumonia. EXAM: PORTABLE CHEST 1 VIEW COMPARISON:  06/18/2016 FINDINGS: The cardiomediastinal silhouette is within normal limits. Bilateral lung opacities, greatest in the left lower lobe, have not significantly changed from the prior study. No sizable pleural effusion or pneumothorax is identified. No acute osseous abnormality is seen. IMPRESSION: Unchanged bilateral lung opacities concerning for multifocal pneumonia. Electronically Signed   By: Sebastian Ache M.D.   On: 06/19/2016 07:28   Dg Chest Port 1 View  Result Date: 06/18/2016 CLINICAL DATA:  COPD with exacerbation EXAM: PORTABLE CHEST 1 VIEW COMPARISON:  06/16/2016 FINDINGS: Normal cardiac silhouette. There is fine airspace disease in the RIGHT upper lobe and LEFT lower lobe similar to comparison exam. No pneumothorax no pleural fluid evident. IMPRESSION: Bilateral airspace disease suggesting pulmonary edema versus multifocal pneumonia. No significant change. Electronically Signed   By: Genevive Bi M.D.   On: 06/18/2016 08:28   Dg Chest Port 1 View  Result Date: 06/16/2016 CLINICAL DATA:  Sudden onset worsening shortness of breath beginning 3 days ago. Chills and diaphoresis. Decreased oxygenation. History of hypertension, melanoma, tobacco abuse. EXAM: PORTABLE CHEST 1 VIEW COMPARISON:  04/05/2014 FINDINGS: Normal heart size and pulmonary vascularity. Areas of airspace infiltration demonstrated in the right upper lung and left mid and lower lung. Peribronchial thickening. Changes suspicious for multifocal pneumonia. No pneumothorax. Mediastinal contours appear intact. IMPRESSION: Airspace infiltrates in the right upper lung and left lower lung suspicious for multifocal pneumonia. Electronically Signed   By: Lucienne Capers M.D.   On: 06/16/2016 04:17   Subjective:  O2 sat in mid-80s overnight while asleep.  Feels the same as yesterday.  Really does not  like the bipap mask.  Denies constipation, last BM a day ago.  Dyspneic with minimal exertion.    Discharge Exam: Vitals:   06/24/16 0553 06/24/16 1358  BP: 100/85 (!) 105/59  Pulse: 76 75  Resp: (!) 22 18  Temp: 97.4 F (36.3 C) 97.6 F (36.4 C)   Vitals:   06/24/16 0805 06/24/16 0815 06/24/16 1357 06/24/16 1358  BP:    (!) 105/59  Pulse:    75  Resp:    18  Temp:    97.6 F (36.4 C)  TempSrc:    Oral  SpO2: 97% 92% 92% 93%  Weight:      Height:       General exam:  Adult male, thin, mild tachypnea at rest. HEENT:  NCAT, MMM Respiratory system:  Stable fine rales at the left base, no rhonchi.  No wheeze Cardiovascular system: Regular rate and rhythm, normal S1/S2. No murmurs, rubs, gallops or clicks.  Warm extremities Gastrointestinal system: Normal active bowel sounds, soft, nondistended, nontender. MSK:  Normal tone and bulk, no lower extremity edema Neuro:  Grossly intact    The results of significant diagnostics from this hospitalization (including imaging, microbiology, ancillary and laboratory) are listed below for reference.     Microbiology: Recent Results (from the past 240 hour(s))  Blood Culture (routine x 2)     Status: None   Collection Time: 06/16/16  4:32 AM  Result Value Ref Range Status   Specimen Description BLOOD LEFT FOREARM  Final   Special Requests BOTTLES DRAWN AEROBIC AND ANAEROBIC 5ML  Final   Culture   Final    NO GROWTH 5 DAYS Performed at [facility]    Report Status 06/21/2016 FINAL  Final  Blood Culture (routine x 2)     Status: None   Collection Time: 06/16/16  4:32 AM  Result Value Ref Range Status   Specimen Description BLOOD LEFT FOREARM LOWER  Final   Special Requests BOTTLES DRAWN AEROBIC AND ANAEROBIC 5ML  Final   Culture   Final    NO GROWTH 5 DAYS Performed at [facility]    Report Status 06/21/2016 FINAL  Final  Urine culture     Status: None   Collection Time: 06/16/16  5:48 AM  Result Value Ref  Range Status   Specimen Description URINE, CLEAN CATCH  Final   Special Requests NONE  Final   Culture NO GROWTH Performed at [facility]   Final   Report Status 06/17/2016 FINAL  Final  MRSA PCR Screening     Status: None   Collection Time: 06/16/16  8:01 AM  Result Value Ref Range Status   MRSA by PCR NEGATIVE NEGATIVE Final    Comment:        The GeneXpert MRSA Assay (FDA approved for NASAL specimens only), is one component of a comprehensive MRSA colonization surveillance program. It is not intended to diagnose MRSA infection nor to guide or monitor treatment for  MRSA infections.   Respiratory Panel by PCR     Status: None   Collection Time: 06/22/16 12:13 PM  Result Value Ref Range Status   Adenovirus NOT DETECTED NOT DETECTED Final   Coronavirus 229E NOT DETECTED NOT DETECTED Final   Coronavirus HKU1 NOT DETECTED NOT DETECTED Final   Coronavirus NL63 NOT DETECTED NOT DETECTED Final   Coronavirus OC43 NOT DETECTED NOT DETECTED Final   Metapneumovirus NOT DETECTED NOT DETECTED Final   Rhinovirus / Enterovirus NOT DETECTED NOT DETECTED Final   Influenza A NOT DETECTED NOT DETECTED Final   Influenza B NOT DETECTED NOT DETECTED Final   Parainfluenza Virus 1 NOT DETECTED NOT DETECTED Final   Parainfluenza Virus 2 NOT DETECTED NOT DETECTED Final   Parainfluenza Virus 3 NOT DETECTED NOT DETECTED Final   Parainfluenza Virus 4 NOT DETECTED NOT DETECTED Final   Respiratory Syncytial Virus NOT DETECTED NOT DETECTED Final   Bordetella pertussis NOT DETECTED NOT DETECTED Final   Chlamydophila pneumoniae NOT DETECTED NOT DETECTED Final   Mycoplasma pneumoniae NOT DETECTED NOT DETECTED Final    Comment: Performed at [place]: BNP (last 3 results)  Recent Labs  06/16/16 0346  BNP 557.3*   Basic Metabolic Panel:  Recent Labs Lab 06/18/16 0740 06/19/16 0303 06/20/16 0318 06/21/16 0303 06/23/16 0505 06/24/16 0435  NA 137 136 134* 138 136   --   K 3.6 3.4* 3.8 4.1 5.0  --   CL 102 98* 91* 97* 97*  --   CO2 27 32 34* 33* 32  --   GLUCOSE 126* 161* 201* 124* 124*  --   BUN 11 20 22* 17 19  --   CREATININE 0.58* 0.83 1.00 0.67 0.71  --   CALCIUM 7.8* 7.9* 8.1* 8.1* 8.1*  --   MG  --   --   --   --  2.4 2.2  PHOS  --   --   --   --  4.9* 4.3   Liver Function Tests:  Recent Labs Lab 06/19/16 0303  AST 27  ALT 27  ALKPHOS 72  BILITOT 0.7  PROT 5.3*  ALBUMIN 2.4*   No results for input(s): LIPASE, AMYLASE in the last 168 hours. No results for input(s): AMMONIA in the last 168 hours. CBC:  Recent Labs Lab 06/18/16 0740 06/19/16 0303 06/20/16 0318 06/21/16 0303 06/23/16 0505  WBC 26.9* 22.6* 26.6* 25.3* 22.7*  NEUTROABS  --  21.5*  --   --   --   HGB 11.0* 10.2* 11.7* 12.1* 13.0  HCT 33.5* 30.2* 35.4* 36.5* 39.5  MCV 94.6 91.8 95.2 95.3 95.6  PLT 225 162 178 159 170   Cardiac Enzymes: No results for input(s): CKTOTAL, CKMB, CKMBINDEX, TROPONINI in the last 168 hours. BNP: Invalid input(s): POCBNP CBG: No results for input(s): GLUCAP in the last 168 hours. D-Dimer No results for input(s): DDIMER in the last 72 hours. Hgb A1c No results for input(s): HGBA1C in the last 72 hours. Lipid Profile No results for input(s): CHOL, HDL, LDLCALC, TRIG, CHOLHDL, LDLDIRECT in the last 72 hours. Thyroid function studies No results for input(s): TSH, T4TOTAL, T3FREE, THYROIDAB in the last 72 hours.  Invalid input(s): FREET3 Anemia work up No results for input(s): VITAMINB12, FOLATE, FERRITIN, TIBC, IRON, RETICCTPCT in the last 72 hours. Urinalysis    Component Value Date/Time   COLORURINE YELLOW 06/16/2016 [place] 06/16/2016 0548   LABSPEC 1.009 06/16/2016 [place]  6.5 06/16/2016 0548   GLUCOSEU NEGATIVE 06/16/2016 0548   HGBUR LARGE (A) 06/16/2016 0548   BILIRUBINUR NEGATIVE 06/16/2016 0548   KETONESUR NEGATIVE 06/16/2016 0548   PROTEINUR NEGATIVE 06/16/2016 0548   NITRITE NEGATIVE  06/16/2016 0548   LEUKOCYTESUR NEGATIVE 06/16/2016 0548   Sepsis Labs Invalid input(s): PROCALCITONIN,  WBC,  LACTICIDVEN   Time coordinating discharge: Over 30 minutes  SIGNED:   Janece Canterbury, MD  [facility] 06/24/2016, 2:27 PM Pager   If 7PM-7AM, please contact night-coverage [email or web address] Password [email or web address]

## 2016-06-24 NOTE — Progress Notes (Signed)
Called [facility], spoke with Sapna, Therapist, sports. She is aware of pt coming and has gotten a report from the previous nurse.

## 2016-06-24 NOTE — Progress Notes (Signed)
Physical Therapy Treatment Patient Details Name: WIN RAYMUNDO MRN: [id number] DOB: February 09, 1956 Today's Date: 28-Jun-2016    History of Present Illness 60 y/o male heavy chronic smoker presented from ALF with progressive SOB, cough, fever and found in ED to have multifocal pneumonia, requiring bipap therapy, also has hyponatremia.  Pt has paranoid schizophrenia    PT Comments    Pt eager to attempt ambulating today so assisted with ambulating in room however pt only able to tolerate short distance.  Follow Up Recommendations  LTACH (plan for LTAC per chart, if back to ALF recommend HHPT)     Equipment Recommendations  None recommended by PT    Recommendations for Other Services       Precautions / Restrictions Precautions Precautions: Fall Precaution Comments: monitor sats    Mobility  Bed Mobility Overal bed mobility: Independent                Transfers Overall transfer level: Needs assistance Equipment used: Rolling walker (2 wheeled) Transfers: Sit to/from Stand Sit to Stand: Min guard            Ambulation/Gait Ambulation/Gait assistance: Min guard Ambulation Distance (Feet): 24 Feet Assistive device: Rolling walker (2 wheeled) Gait Pattern/deviations: Step-through pattern     General Gait Details: pt ambulated in room due to oxygen set up, pt on HFNC, 35 L/min flow rate, 90% FiO2 and SpO2 remained 97% during ambulation, pt with 3/4 dyspnea limiting distance   Stairs            Wheelchair Mobility    Modified Rankin (Stroke Patients Only)       Balance                                    Cognition Arousal/Alertness: Awake/alert Behavior During Therapy: WFL for tasks assessed/performed Overall Cognitive Status: Within Functional Limits for tasks assessed                      Exercises      General Comments        Pertinent Vitals/Pain Pain Assessment: No/denies pain    Home Living                       Prior Function            PT Goals (current goals can now be found in the care plan section) Progress towards PT goals: Progressing toward goals    Frequency    Min 3X/week      PT Plan Discharge plan needs to be updated    Co-evaluation             End of Session Equipment Utilized During Treatment: Gait belt;Oxygen Activity Tolerance: Patient limited by fatigue Patient left: in bed;with call bell/phone within reach;with bed alarm set     Time: 1026-1040 PT Time Calculation (min) (ACUTE ONLY): 14 min  Charges:  $Gait Training: 8-22 mins                    G Codes:      Robertta Halfhill,KATHrine E June 28, 2016, 2:09 PM Carmelia Bake, PT, DPT 28-Jun-2016 Pager: [phone]

## 2016-06-25 LAB — ANTI-DNA ANTIBODY, DOUBLE-STRANDED: ds DNA Ab: <1 IU/mL (ref 0–9)

## 2016-06-25 LAB — ANTI-SMITH ANTIBODY: ENA SM Ab Ser-aCnc: <0.2 AI (ref 0.0–0.9)

## 2016-06-25 LAB — C4 COMPLEMENT: COMPLEMENT C4, BODY FLUID: 28 mg/dL (ref 14–44)

## 2016-06-25 LAB — HISTONE ANTIBODIES, IGG, BLOOD: DNA-HISTONE: 0.4 U (ref 0.0–0.9)

## 2016-06-25 LAB — C3 COMPLEMENT: C3 COMPLEMENT: 123 mg/dL (ref 82–167)

## 2016-06-26 LAB — MPO/PR-3 (ANCA) ANTIBODIES
ANCA Proteinase 3: <3.5 U/mL (ref 0.0–3.5)
Myeloperoxidase Abs: <9 U/mL (ref 0.0–9.0)

## 2016-06-26 LAB — ANTINUCLEAR ANTIBODIES, IFA: ANA Ab, IFA: NEGATIVE

## 2016-06-26 LAB — GLOMERULAR BASEMENT MEMBRANE ANTIBODIES: GBM Ab: 4 units (ref 0–20)

## 2016-06-26 LAB — CYCLIC CITRUL PEPTIDE ANTIBODY, IGG/IGA: CCP ANTIBODIES IGG/IGA: 5 U (ref 0–19)

## 2016-06-26 LAB — RHEUMATOID FACTOR: Rhuematoid fact SerPl-aCnc: 13.4 IU/mL (ref 0.0–13.9)

## 2016-07-03 ENCOUNTER — Encounter (HOSPITAL_COMMUNITY): Payer: Self-pay

## 2016-07-03 ENCOUNTER — Emergency Department (HOSPITAL_COMMUNITY): Payer: Medicare Other

## 2016-07-03 ENCOUNTER — Inpatient Hospital Stay (HOSPITAL_COMMUNITY)
Admission: EM | Admit: 2016-07-03 | Discharge: 2016-07-04 | DRG: 199 | Disposition: A | Discharge status: Other Acute Inpatient Hospital | Payer: Medicare Other | Attending: Family Medicine | Admitting: Family Medicine

## 2016-07-03 ENCOUNTER — Inpatient Hospital Stay (HOSPITAL_COMMUNITY): Payer: Medicare Other

## 2016-07-03 DIAGNOSIS — J189 Pneumonia, unspecified organism: Secondary | ICD-10-CM

## 2016-07-03 DIAGNOSIS — Z8701 Personal history of pneumonia (recurrent): Secondary | ICD-10-CM | POA: Diagnosis not present

## 2016-07-03 DIAGNOSIS — J939 Pneumothorax, unspecified: Secondary | ICD-10-CM | POA: Diagnosis present

## 2016-07-03 DIAGNOSIS — J9311 Primary spontaneous pneumothorax: Secondary | ICD-10-CM | POA: Diagnosis not present

## 2016-07-03 DIAGNOSIS — F200 Paranoid schizophrenia: Secondary | ICD-10-CM | POA: Diagnosis present

## 2016-07-03 DIAGNOSIS — I10 Essential (primary) hypertension: Secondary | ICD-10-CM | POA: Diagnosis not present

## 2016-07-03 DIAGNOSIS — J9382 Other air leak: Secondary | ICD-10-CM | POA: Diagnosis not present

## 2016-07-03 DIAGNOSIS — Z888 Allergy status to other drugs, medicaments and biological substances status: Secondary | ICD-10-CM

## 2016-07-03 DIAGNOSIS — I5032 Chronic diastolic (congestive) heart failure: Secondary | ICD-10-CM | POA: Diagnosis present

## 2016-07-03 DIAGNOSIS — F17210 Nicotine dependence, cigarettes, uncomplicated: Secondary | ICD-10-CM | POA: Diagnosis present

## 2016-07-03 DIAGNOSIS — Z8042 Family history of malignant neoplasm of prostate: Secondary | ICD-10-CM

## 2016-07-03 DIAGNOSIS — D72829 Elevated white blood cell count, unspecified: Secondary | ICD-10-CM | POA: Diagnosis not present

## 2016-07-03 DIAGNOSIS — Z7952 Long term (current) use of systemic steroids: Secondary | ICD-10-CM

## 2016-07-03 DIAGNOSIS — Z8249 Family history of ischemic heart disease and other diseases of the circulatory system: Secondary | ICD-10-CM

## 2016-07-03 DIAGNOSIS — R590 Localized enlarged lymph nodes: Secondary | ICD-10-CM | POA: Diagnosis present

## 2016-07-03 DIAGNOSIS — J9383 Other pneumothorax: Principal | ICD-10-CM | POA: Diagnosis present

## 2016-07-03 DIAGNOSIS — Z9981 Dependence on supplemental oxygen: Secondary | ICD-10-CM

## 2016-07-03 DIAGNOSIS — J449 Chronic obstructive pulmonary disease, unspecified: Secondary | ICD-10-CM | POA: Diagnosis present

## 2016-07-03 DIAGNOSIS — Z7951 Long term (current) use of inhaled steroids: Secondary | ICD-10-CM | POA: Diagnosis not present

## 2016-07-03 DIAGNOSIS — Z66 Do not resuscitate: Secondary | ICD-10-CM | POA: Diagnosis present

## 2016-07-03 DIAGNOSIS — J439 Emphysema, unspecified: Secondary | ICD-10-CM | POA: Diagnosis not present

## 2016-07-03 DIAGNOSIS — I110 Hypertensive heart disease with heart failure: Secondary | ICD-10-CM | POA: Diagnosis present

## 2016-07-03 DIAGNOSIS — J9691 Respiratory failure, unspecified with hypoxia: Secondary | ICD-10-CM | POA: Diagnosis present

## 2016-07-03 DIAGNOSIS — J849 Interstitial pulmonary disease, unspecified: Secondary | ICD-10-CM

## 2016-07-03 DIAGNOSIS — J90 Pleural effusion, not elsewhere classified: Secondary | ICD-10-CM

## 2016-07-03 DIAGNOSIS — Z938 Other artificial opening status: Secondary | ICD-10-CM

## 2016-07-03 DIAGNOSIS — R918 Other nonspecific abnormal finding of lung field: Secondary | ICD-10-CM | POA: Diagnosis present

## 2016-07-03 DIAGNOSIS — Z7401 Bed confinement status: Secondary | ICD-10-CM

## 2016-07-03 DIAGNOSIS — T380X5A Adverse effect of glucocorticoids and synthetic analogues, initial encounter: Secondary | ICD-10-CM | POA: Diagnosis present

## 2016-07-03 DIAGNOSIS — J9621 Acute and chronic respiratory failure with hypoxia: Secondary | ICD-10-CM | POA: Diagnosis present

## 2016-07-03 DIAGNOSIS — Z85820 Personal history of malignant melanoma of skin: Secondary | ICD-10-CM

## 2016-07-03 DIAGNOSIS — J9601 Acute respiratory failure with hypoxia: Secondary | ICD-10-CM | POA: Diagnosis present

## 2016-07-03 DIAGNOSIS — J410 Simple chronic bronchitis: Secondary | ICD-10-CM

## 2016-07-03 DIAGNOSIS — R911 Solitary pulmonary nodule: Secondary | ICD-10-CM | POA: Diagnosis present

## 2016-07-03 DIAGNOSIS — Z7982 Long term (current) use of aspirin: Secondary | ICD-10-CM

## 2016-07-03 HISTORY — DX: Pneumonia, unspecified organism: J18.9

## 2016-07-03 HISTORY — DX: Pneumothorax, unspecified: J93.9

## 2016-07-03 HISTORY — DX: Chronic obstructive pulmonary disease, unspecified: J44.9

## 2016-07-03 HISTORY — PX: CHEST TUBE INSERTION: SHX231

## 2016-07-03 LAB — I-STAT TROPONIN, ED: Troponin i, poc: 0 ng/mL (ref 0.00–0.08)

## 2016-07-03 LAB — LACTATE DEHYDROGENASE, PLEURAL OR PERITONEAL FLUID: LD, Fluid: 1362 U/L — ABNORMAL HIGH (ref 3–23)

## 2016-07-03 LAB — GLUCOSE, SEROUS FLUID: Glucose, Fluid: 96 mg/dL

## 2016-07-03 LAB — BASIC METABOLIC PANEL
ANION GAP: 6 (ref 5–15)
BUN: 15 mg/dL (ref 6–20)
CO2: 30 mmol/L (ref 22–32)
Calcium: 8.6 mg/dL — ABNORMAL LOW (ref 8.9–10.3)
Chloride: 100 mmol/L — ABNORMAL LOW (ref 101–111)
Creatinine, Ser: 0.75 mg/dL (ref 0.61–1.24)
Glucose, Bld: 118 mg/dL — ABNORMAL HIGH (ref 65–99)
POTASSIUM: 4.2 mmol/L (ref 3.5–5.1)
SODIUM: 136 mmol/L (ref 135–145)

## 2016-07-03 LAB — CBC WITH DIFFERENTIAL/PLATELET
BASOS ABS: 0 10*3/uL (ref 0.0–0.1)
Basophils Relative: 0 %
EOS ABS: 0.1 10*3/uL (ref 0.0–0.7)
EOS PCT: 0 %
HCT: 40 % (ref 39.0–52.0)
Hemoglobin: 13.3 g/dL (ref 13.0–17.0)
Lymphocytes Relative: 7 %
Lymphs Abs: 1.2 10*3/uL (ref 0.7–4.0)
MCH: 31.5 pg (ref 26.0–34.0)
MCHC: 33.3 g/dL (ref 30.0–36.0)
MCV: 94.8 fL (ref 78.0–100.0)
Monocytes Absolute: 0.8 10*3/uL (ref 0.1–1.0)
Monocytes Relative: 5 %
Neutro Abs: 14.6 10*3/uL — ABNORMAL HIGH (ref 1.7–7.7)
Neutrophils Relative %: 88 %
PLATELETS: 294 10*3/uL (ref 150–400)
RBC: 4.22 MIL/uL (ref 4.22–5.81)
RDW: 14.1 % (ref 11.5–15.5)
WBC: 16.6 10*3/uL — AB (ref 4.0–10.5)

## 2016-07-03 LAB — PROTEIN, BODY FLUID: Total protein, fluid: <3 g/dL

## 2016-07-03 LAB — PROTIME-INR
INR: 1.02
PROTHROMBIN TIME: 13.4 s (ref 11.4–15.2)

## 2016-07-03 MED ORDER — DEXAMETHASONE 4 MG PO TABS
4.0000 mg | ORAL_TABLET | Freq: Two times a day (BID) | ORAL | Status: DC
  Administered 2016-07-04: 4 mg via ORAL
  Filled 2016-07-03: qty 1

## 2016-07-03 MED ORDER — MORPHINE SULFATE (PF) 4 MG/ML IV SOLN
4.0000 mg | Freq: Once | INTRAVENOUS | Status: AC
  Administered 2016-07-03: 4 mg via INTRAVENOUS
  Filled 2016-07-03: qty 1

## 2016-07-03 MED ORDER — LIDOCAINE-EPINEPHRINE (PF) 2 %-1:200000 IJ SOLN
10.0000 mL | Freq: Once | INTRAMUSCULAR | Status: AC
  Administered 2016-07-03: 10 mL via INTRADERMAL
  Filled 2016-07-03: qty 20

## 2016-07-03 MED ORDER — SODIUM CHLORIDE 0.9 % IV SOLN
1500.0000 mg | Freq: Once | INTRAVENOUS | Status: AC
  Administered 2016-07-03: 1500 mg via INTRAVENOUS
  Filled 2016-07-03: qty 1500

## 2016-07-03 MED ORDER — DEXTROSE 5 % IV SOLN
1.0000 g | Freq: Once | INTRAVENOUS | Status: AC
  Administered 2016-07-03: 1 g via INTRAVENOUS
  Filled 2016-07-03: qty 1

## 2016-07-03 NOTE — ED Triage Notes (Signed)
Pt arrives [facility] from [facility] with c/o [facility] and chest pain 4 days ago that resolved 2 days ago. Also c/o exertional SHOB and has moderate left sided pneumothorax. Sent for chest tube  Placement.

## 2016-07-03 NOTE — Progress Notes (Signed)
Pharmacy Antibiotic Note  Jeffrey Ochoa is a 60 y.o. male admitted on 07/03/2016 with pneumonia.  Pharmacy has been consulted for vancmycin dosing.  Plan: Vancomycin 1500mg  IV once then 750mg  IV every 8 hours.  Goal trough 15-20 mcg/mL.  Height: 6\' 7"  (200.7 cm) Weight: 175 lb (79.4 kg) IBW/kg (Calculated) : 93.7  Temp (24hrs), Avg:98.4 F (36.9 C), Min:98.4 F (36.9 C), Max:98.4 F (36.9 C)   Recent Labs Lab 07/03/16 1714  WBC 16.6*  CREATININE 0.75    Estimated Creatinine Clearance: 108.9 mL/min (by C-G formula based on SCr of 0.75 mg/dL).    Allergies  Allergen Reactions  . Risperidone And Related Other (See Comments)    Makes him Manic     Thank you for allowing pharmacy to be a part of this patient's care.  Jodean Lima Raymond Azure 07/03/2016 8:13 PM

## 2016-07-03 NOTE — ED Notes (Signed)
Chest tube tray at bedside, chest tube sent up and consent completed.

## 2016-07-03 NOTE — H&P (Addendum)
Jeffrey Ochoa:[id number] DOB: 1955-11-15 DOA: 07/03/2016     PCP: Penni Homans, MD   Outpatient Specialists: none    Patient coming from: From [facility]  Chief Complaint: shortness of breath, chest pain  HPI: Jeffrey Ochoa is a 60 y.o. male with medical history significant of COPD, O2 focal pneumonia and hyponatremia, Schizophrenia, HTN leukocytosis CHF with diastolic dysfunction, underlining lung disease    Presented with worsening shortness of breath  Patient has been recently admitted for acute respiratory failure with hypoxia secondary to multifocal pneumonia associated with COPD exacerbation. HIV was negative and CT scan of the chest showed mixed groundglass since the pill consolidation worrisome for autoimmune process Legionella N was negative patient was treated with steroids also undergone rheumatology workup Anti-GBM negative, ESR 22, anti-CCP negative, ANA negative. C3 and C4 complement within normal limits. Echogram was done and showed preserved EF and grade 1 diastolic dysfunction patient was treated with azithromycin L Clemons Zosyn and vancomycin. No nodule felt that his findings could be consistent with atypical UIP versus subacute hypersensitivity pneumonitis versus fibrosing and NSIP worried if it secondary to autoimmune process he was discharged to [facility] on Solu-Medrol now changed to decadrone Family states that at the [facility] he required up to 9 L of oxygen Regarding pertinent Chronic problems: Patient has history of melanoma of his back continues to smoke   IN ER:  Temp (24hrs), Avg:98.4 F (36.9 C), Min:98.4 F (36.9 C), Max:98.4 F (36.9 C)      RR 23 O2 sat 99% on NR 15L HR 76 BP 117/74 WBC 16.6 down from 22.7  Cr 0.75  CXR showed 30-40% pneumothorax. Chest tube was placed by ER MD  With improvement of pneumothorax Chest tube had to be repositioned a repeat chest x-ray showing slight increase in pneumothorax patient continued to have increased  shortness of breath admitted to stepdown  Following Medications were ordered in ER: Medications  vancomycin (VANCOCIN) 1,500 mg in sodium chloride 0.9 % 500 mL IVPB (not administered)  lidocaine-EPINEPHrine (XYLOCAINE W/EPI) 2 %-1:200000 (PF) injection 10 mL (10 mLs Intradermal Given 07/03/16 1748)  morphine 4 MG/ML injection 4 mg (4 mg Intravenous Given 07/03/16 1824)  morphine 4 MG/ML injection 4 mg (4 mg Intravenous Given 07/03/16 1949)  ceFEPIme (MAXIPIME) 1 g in dextrose 5 % 50 mL IVPB (1 g Intravenous New Bag/Given 07/03/16 2021)   ER discuss case with cardiothoracic surgery who will see patient in consult tomorrow  Hospitalist was called for admission for Acute respiratory failure with hypoxia secondary to pneumothorax  Review of Systems:    Pertinent positives include: chest pain, shortness of breath at rest.non-productive cough  Constitutional:  No weight loss, night sweats, Fevers, chills, fatigue, weight loss  HEENT:  No headaches, Difficulty swallowing,Tooth/dental problems,Sore throat,  No sneezing, itching, ear ache, nasal congestion, post nasal drip,  Cardio-vascular:  No  Orthopnea, PND, anasarca, dizziness, palpitations.no Bilateral lower extremity swelling  GI:  No heartburn, indigestion, abdominal pain, nausea, vomiting, diarrhea, change in bowel habits, loss of appetite, melena, blood in stool, hematemesis Resp:  no  No dyspnea on exertion, No excess mucus, no productive cough, No , No coughing up of blood.No change in color of mucus.No wheezing. Skin:  no rash or lesions. No jaundice GU:  no dysuria, change in color of urine, no urgency or frequency. No straining to urinate.  No flank pain.  Musculoskeletal:  No joint pain or no joint swelling. No decreased range of motion. No back  pain.  Psych:  No change in mood or affect. No depression or anxiety. No memory loss.  Neuro: no localizing neurological complaints, no tingling, no weakness, no double vision, no  gait abnormality, no slurred speech, no confusion  As per HPI otherwise 10 point review of systems negative.   Past Medical History: Past Medical History:  Diagnosis Date  . Chicken pox as a child  . HTN (hypertension) 12/05/2013  . Hypertension   . Measles as a child  . Melanoma of back ([place])   . Mumps as a child  . Schizophrenia, paranoid ([place]) 1985  . Tobacco use disorder 12/05/2013   2 ppd  . Unspecified hereditary and idiopathic peripheral neuropathy 12/05/2013   Past Surgical History:  Procedure Laterality Date  . CHOLECYSTECTOMY  1985  . MOHS SURGERY     back  . MULTIPLE TOOTH EXTRACTIONS     has all teeth removed  . WISDOM TOOTH EXTRACTION       Social History:  Ambulatory   bed bound    reports that he has been smoking Cigarettes.  He has been smoking about 2.00 packs per day. He has quit using smokeless tobacco. He reports that he uses drugs, including Marijuana. He reports that he does not drink alcohol.  Allergies:   Allergies  Allergen Reactions  . Risperidone And Related Other (See Comments)    Makes him Manic        Family History:   Family History  Problem Relation Age of Onset  . Hypertension Mother   . Prostate cancer Father   . Aortic dissection Father   . Fibromyalgia Sister   . Hypertension Sister   . Hyperlipidemia Sister     Medications: Prior to Admission medications   Medication Sig Start Date End Date Taking? Authorizing Provider  albuterol (PROVENTIL) (2.5 MG/3ML) 0.083% nebulizer solution Take 3 mLs (2.5 mg total) by nebulization 3 (three) times daily. 06/24/16   Janece Canterbury, MD  aspirin EC 81 MG EC tablet Take 1 tablet (81 mg total) by mouth daily. 06/25/16   Janece Canterbury, MD  atenolol (TENORMIN) 25 MG tablet Take 25 mg by mouth 2 (two) times daily.    Historical Provider, MD  atorvastatin (LIPITOR) 40 MG tablet Take 1 tablet (40 mg total) by mouth daily at 6 PM. 06/24/16   Janece Canterbury, MD  benztropine (COGENTIN) 2 MG  tablet Take 2 mg by mouth 2 (two) times daily.     Historical Provider, MD  bisacodyl (DULCOLAX) 10 MG suppository Place 1 suppository (10 mg total) rectally daily as needed for mild constipation or moderate constipation. 06/24/16   Janece Canterbury, MD  budesonide (PULMICORT) 0.25 MG/2ML nebulizer solution Take 2 mLs (0.25 mg total) by nebulization 2 (two) times daily. 06/24/16   Janece Canterbury, MD  busPIRone (BUSPAR) 10 MG tablet Take 10 mg by mouth 3 (three) times daily.    Historical Provider, MD  clonazePAM (KLONOPIN) 1 MG tablet Take 1 tablet (1 mg total) by mouth 2 (two) times daily. 06/24/16   Janece Canterbury, MD  cyclobenzaprine (FLEXERIL) 5 MG tablet Take 1 tablet (5 mg total) by mouth 3 (three) times daily as needed for muscle spasms. 06/24/16   Janece Canterbury, MD  fluPHENAZine decanoate (PROLIXIN) 25 MG/ML injection Inject 75 mg into the muscle every 14 (fourteen) days.    Historical Provider, MD  ipratropium-albuterol (DUONEB) 0.5-2.5 (3) MG/3ML SOLN Take 3 mLs by nebulization every 2 (two) hours as needed. 06/24/16   Janece Canterbury, MD  methylPREDNISolone sodium succinate (SOLU-MEDROL) 125 mg/2 mL injection Inject 0.96 mLs (60 mg total) into the vein 2 (two) times daily. 06/24/16   Janece Canterbury, MD  mouth rinse LIQD solution 15 mLs by Mouth Rinse route 2 (two) times daily. 06/24/16   Janece Canterbury, MD  nicotine (NICODERM CQ - DOSED IN MG/24 HOURS) 21 mg/24hr patch Place 1 patch (21 mg total) onto the skin daily. 06/25/16   Janece Canterbury, MD  oxymetazoline (AFRIN) 0.05 % nasal spray Place 1 spray into both nostrils 2 (two) times daily as needed for congestion. 06/24/16   Janece Canterbury, MD  polyethylene glycol [facility] / GLYCOLAX) packet Take 17 g by mouth daily. 06/25/16   Janece Canterbury, MD  senna (SENOKOT) 8.6 MG TABS tablet Take 2 tablets (17.2 mg total) by mouth at bedtime. 06/24/16   Janece Canterbury, MD  sertraline (ZOLOFT) 50 MG tablet Take 100 mg by mouth 2 (two) times  daily.     Historical Provider, MD  sodium chloride (OCEAN) 0.65 % SOLN nasal spray Place 1 spray into both nostrils as needed for congestion. 06/24/16   Janece Canterbury, MD  tiotropium (SPIRIVA) 18 MCG inhalation capsule Place 18 mcg into inhaler and inhale daily.    Historical Provider, MD    Physical Exam: Patient Vitals for the past 24 hrs:  BP Temp Temp src Pulse Resp SpO2 Height Weight  07/03/16 2000 130/85 - - - (!) 28 - - -  07/03/16 1915 127/83 - - 103 - - - -  07/03/16 1830 112/71 - - 85 (!) 29 100 % - -  07/03/16 1815 115/74 - - 83 22 100 % - -  07/03/16 1745 122/74 - - 80 20 100 % - -  07/03/16 1730 125/74 - - 84 (!) 27 98 % - -  07/03/16 1645 122/78 - - 64 22 100 % - -  07/03/16 1630 104/78 - - 83 18 100 % - -  07/03/16 1624 - - - - - - 6' 7"  (2.007 m) 79.4 kg (175 lb)  07/03/16 1623 107/73 98.4 F (36.9 C) Oral 85 24 98 % - -  07/03/16 1615 103/65 - - 79 (!) 30 92 % - -  07/03/16 1608 - - - - - 92 % - -    1. General:  in No Acute distress 2. Psychological: Alert and  Oriented 3. Head/ENT:    Dry Mucous Membranes                          Head Non traumatic, neck supple                           Poor Dentition 4. SKIN:    decreased Skin turgor,  Skin clean Dry and intact no rash 5. Heart: Regular rate and rhythm no  Murmur, Rub or gallop 6. Lungs:  no wheezes difuse crackles   7. Abdomen: Soft,  non-tender, Non distended 8. Lower extremities: no clubbing, cyanosis, or edema 9. Neurologically Grossly intact, moving all 4 extremities equally   10. MSK: Normal range of motion   body mass index is 19.71 kg/m.  Labs on Admission:   Labs on Admission: I have personally reviewed following labs and imaging studies  CBC:  Recent Labs Lab 07/03/16 1714  WBC 16.6*  NEUTROABS 14.6*  HGB 13.3  HCT 40.0  MCV 94.8  PLT 578   Basic Metabolic Panel:  Recent  Labs Lab 07/03/16 1714  NA 136  K 4.2  CL 100*  CO2 30  GLUCOSE 118*  BUN 15  CREATININE 0.75    CALCIUM 8.6*   GFR: Estimated Creatinine Clearance: 108.9 mL/min (by C-G formula based on SCr of 0.75 mg/dL). Liver Function Tests: No results for input(s): AST, ALT, ALKPHOS, BILITOT, PROT, ALBUMIN in the last 168 hours. No results for input(s): LIPASE, AMYLASE in the last 168 hours. No results for input(s): AMMONIA in the last 168 hours. Coagulation Profile:  Recent Labs Lab 07/03/16 1714  INR 1.02   Cardiac Enzymes: No results for input(s): CKTOTAL, CKMB, CKMBINDEX, TROPONINI in the last 168 hours. BNP (last 3 results) No results for input(s): PROBNP in the last 8760 hours. HbA1C: No results for input(s): HGBA1C in the last 72 hours. CBG: No results for input(s): GLUCAP in the last 168 hours. Lipid Profile: No results for input(s): CHOL, HDL, LDLCALC, TRIG, CHOLHDL, LDLDIRECT in the last 72 hours. Thyroid Function Tests: No results for input(s): TSH, T4TOTAL, FREET4, T3FREE, THYROIDAB in the last 72 hours. Anemia Panel: No results for input(s): VITAMINB12, FOLATE, FERRITIN, TIBC, IRON, RETICCTPCT in the last 72 hours.  Sepsis Labs: @LABRCNTIP (procalcitonin:4,lacticidven:4) )No results found for this or any previous visit (from the past 240 hour(s)).     UA not ordered  No results found for: HGBA1C  Estimated Creatinine Clearance: 108.9 mL/min (by C-G formula based on SCr of 0.75 mg/dL).  BNP (last 3 results) No results for input(s): PROBNP in the last 8760 hours.   ECG REPORT  Independently reviewed Rate:77  Rhythm: NSR ST&T Change: No acute ischemic changes   QTC 458  Filed Weights   07/03/16 1624  Weight: 79.4 kg (175 lb)     Cultures:    Component Value Date/Time   SDES URINE, CLEAN CATCH 06/16/2016 0548   SPECREQUEST NONE 06/16/2016 0548   CULT NO GROWTH Performed at [facility]  06/16/2016 0548   REPTSTATUS 06/17/2016 FINAL 06/16/2016 0548     Radiological Exams on Admission: Dg Chest Portable 1 View  Result Date:  07/03/2016 CLINICAL DATA:  Chest tube placement. EXAM: PORTABLE CHEST 1 VIEW COMPARISON:  07/03/2016 at 1629 hours FINDINGS: The cardiomediastinal silhouette is unchanged and within normal limits. A left-sided chest tube has been placed and terminates over the posterior sixth rib interspace. There is only a small residual left apical pneumothorax, greatly decreased from prior. Severe chronic chronic interstitial lung disease is again noted. No sizable pleural effusion is seen. IMPRESSION: Interval left chest tube placement with small residual pneumothorax. Electronically Signed   By: Logan Bores M.D.   On: 07/03/2016 19:28   Dg Chest Portable 1 View  Result Date: 07/03/2016 CLINICAL DATA:  Chest pain and shortness of breath. EXAM: PORTABLE CHEST 1 VIEW COMPARISON:  06/22/2016 FINDINGS: Moderate left-sided pneumothorax present of approximately 30-40% volume. Underlying severe chronic fibrotic lung disease with potential multifocal pneumonia again noted present. No significant component of pleural fluid. No significant shift of the mediastinal structures. The heart size is normal. IMPRESSION: Proximally 30-40% left pneumothorax. These results were called by telephone at the time of interpretation on 07/03/2016 at 4:43 pm to Dr. Marda Stalker , who verbally acknowledged these results. Electronically Signed   By: Aletta Edouard M.D.   On: 07/03/2016 16:47    Chart has been reviewed    Assessment/Plan  60 y.o. male with medical history significant of COPD, O2 focal pneumonia and hyponatremia, Schizophrenia, HTN leukocytosis CHF with diastolic dysfunction, underlining  lung disease admitted for acute respiratory failure secondary to pneumothorax  Present on Admission: . Acute respiratory failure with hypoxia (HCC) secondary to pneumothorax oxygen saturation improved after chest tube placement we will appreciate cardiothoracic surgery input . HTN (hypertension) - stable continue to monitor  currently somewhat soft blood pressures . Leukocytosis patient has been on steroids white blood cell count is coming down no fever to suggest infectious process . Multifocal infiltrates - as per review of notes from pulmonology suggestive of autoimmune disorder. Appreciate pulmonology consult regarding further management. We'll hold off on IV antibiotics for now deferred to pulmonology regarding treatment of underlying pulmonary disease Given pleural fluid obtained have been sent for studies including cytology cultures and cell count . Pneumothorax - status post chest tube placement appreciate cardiothoracic surgery input COPD chronic continue home medicaitons   Other plan as per orders.  DVT prophylaxis:  SCD   Code Status:    DNR/DNI   as per patient     Family Communication:   Family at  Bedside  plan of care was discussed with   Sister,   Disposition Plan:                             Back to current facility when stable                                                Would benefit from PT/OT eval prior to DC   ordered                       Social Work   Nutrition                          Consults called: Pulmonology cardiothoracic surgery  Admission status:    inpatient       Level of care    SDU      I have spent a total of 67 min on this admission  extra time was spent to discuss case with pulmonology   [place] 07/03/2016, 11:27 PM    [facility]  Pager [phone]   after 2 AM please page floor coverage PA If 7AM-7PM, please contact the day team taking care of the patient  [email or web address]  Password [email or web address]

## 2016-07-03 NOTE — ED Provider Notes (Signed)
CHEST TUBE INSERTION Date/Time: 07/03/2016 6:57 PM Performed by: Ron Parker, Clotilda Hafer Authorized by: Ron Parker, Treysean Petruzzi   Consent:    Consent obtained:  Verbal and written   Consent given by:  Patient   Alternatives discussed:  No treatment Pre-procedure details:    Skin preparation:  Betadine and ChloraPrep   Preparation: Patient was prepped and draped in the usual sterile fashion   Anesthesia (see MAR for exact dosages):    Anesthesia method:  Local infiltration   Local anesthetic:  Lidocaine 1% WITH epi Procedure details:    Placement location:  L lateral   Scalpel size:  11   Tube size ([place]): 32.   Dissection instrument:  Finger and Kelly clamp   Ultrasound guidance: no     Tension pneumothorax: no     Tube connected to:  Suction   Drainage characteristics:  Serosanguinous   Suture material:  0 silk   Dressing:  4x4 sterile gauze and Xeroform gauze Post-procedure details:    Post-insertion x-ray findings: tube in good position     Patient tolerance of procedure:  Tolerated well, no immediate complications       Dewaine Conger, MD 07/03/16 [place], MD 07/04/16 [phone]

## 2016-07-03 NOTE — Consult Note (Signed)
Name: Jeffrey Ochoa MRN: [id number] DOB: 25-Nov-1955    ADMISSION DATE:  07/03/2016 CONSULTATION DATE:  11/29  REFERRING MD :  Dr. Roel Cluck  CHIEF COMPLAINT:  Dyspnea  BRIEF PATIENT DESCRIPTION: 60 year old male from [facility] recently admitted with what was thought to be acute pneumonitis/ILD. He was treated with steroids and discharged to SNF. Now presenting 11/29 with L PTX.  SIGNIFICANT EVENTS  11/20 discharged from [facility]  11/29 admit to [facility] with PTX  STUDIES:  HRCT CHEST W/O 11/18:  Personally reviewed by me. No obvious cranial to caudal prevalence of patchy bilateral groundglass opacities with some associated bronchiectasis suggesting traction bronchiectasis. Patchy intralobular septal thickening bilaterally consistent with fibrosis. Certainly findings are more prevalent in the lower lung zones. 7 mm right lower lobe noduledue to his and posterior segment. No definitive honeycombing. Questionable tiny left pleural effusion versus pleural thickening. Radiology commented on right pleural effusion but I did not appreciate this. Subcarinal lymph node measuring 1.4 cm in short axis but no other pathologically enlarged mediastinal adenopathy. No pericardial effusion. TTE 11/18: Moderate LVH with grade 1 diastolic dysfunction. Normal regional wall motion. EF 60-65%. LA & RA normal in size. RV normal in size and function. No aortic stenosis or regurgitation. Aortic root normal in size. No mitral stenosis or regurgitation. No significant pulmonic regurgitation. No significant tricuspid regurgitation. No pericardial effusion.    HISTORY OF PRESENT ILLNESS:  60 year old male with past medical history as below, which is significant for schizophrenia, tobacco abuse, and hypertension. He was recently admitted to [facility]  The beginning of November 2017 with complaints of shortness of breath. He had diffuse pulmonary infiltrates on chest x-ray and underwent high-resolution CT which  demonstrated groundglass opacifications with some bronchiectasis. This was thought to be secondary to acute pneumonitis/ILD. Autoimmune workup has essentially been negative since that time. He was started on steroids and discharged to [facility] (where he lives) on 60 mg of Solu-Medrol twice a day. Since that time he indicates that his breathing is not really improved much. 07/03/2016 he began to experience increasing dyspnea and worsening oxygen demands. He is on a Ventimask at baseline, however, he was requiring nonrebreather. [facility] staff called EMS and had him brought to the emergency department where he was noted to have left-sided pneumothorax on x-ray. Chest tube was placed in the emergency department and PCCM asked to see for further evaluation. He denies fever, chills, cough in the days preceding this incident.  PAST MEDICAL HISTORY :   has a past medical history of Chicken pox (as a child); HTN (hypertension) (12/05/2013); Hypertension; Measles (as a child); Melanoma of back ([place]); Mumps (as a child); Schizophrenia, paranoid ([place]) (1985); Tobacco use disorder (12/05/2013); and Unspecified hereditary and idiopathic peripheral neuropathy (12/05/2013).  has a past surgical history that includes Cholecystectomy (1985); Wisdom tooth extraction; Multiple tooth extractions; and Mohs surgery. Prior to Admission medications   Medication Sig Start Date End Date Taking? Authorizing Provider  aspirin EC 81 MG EC tablet Take 1 tablet (81 mg total) by mouth daily. 06/25/16  Yes Janece Canterbury, MD  atenolol (TENORMIN) 25 MG tablet Take 25 mg by mouth 2 (two) times daily.   Yes Historical Provider, MD  benztropine (COGENTIN) 2 MG tablet Take 2 mg by mouth 2 (two) times daily.    Yes Historical Provider, MD  busPIRone (BUSPAR) 10 MG tablet Take 10 mg by mouth 3 (three) times daily.   Yes Historical Provider, MD  clonazePAM (KLONOPIN) 1 MG tablet  Take 1 tablet (1 mg total) by mouth 2 (two) times daily. 06/24/16  Yes  Janece Canterbury, MD  dexamethasone (DECADRON) 4 MG tablet Take 4 mg by mouth 2 (two) times daily with a meal.   Yes Historical Provider, MD  enoxaparin (LOVENOX) 40 MG/0.4ML injection Inject 40 mg into the skin daily.   Yes Historical Provider, MD  ENSURE PLUS (ENSURE PLUS) LIQD Take 237 mLs by mouth 2 (two) times daily between meals.   Yes Historical Provider, MD  fluPHENAZine decanoate (PROLIXIN) 25 MG/ML injection Inject 75 mg into the muscle every 14 (fourteen) days.   Yes Historical Provider, MD  ipratropium-albuterol (DUONEB) 0.5-2.5 (3) MG/3ML SOLN Take 3 mLs by nebulization every 2 (two) hours as needed. Patient taking differently: Take 3 mLs by nebulization every 6 (six) hours.  06/24/16  Yes Janece Canterbury, MD  nicotine (NICODERM CQ - DOSED IN MG/24 HOURS) 21 mg/24hr patch Place 1 patch (21 mg total) onto the skin daily. 06/25/16  Yes Janece Canterbury, MD  polyethylene glycol (MIRALAX / GLYCOLAX) packet Take 17 g by mouth daily. 06/25/16  Yes Janece Canterbury, MD  senna (SENOKOT) 8.6 MG TABS tablet Take 2 tablets (17.2 mg total) by mouth at bedtime. 06/24/16  Yes Janece Canterbury, MD  sertraline (ZOLOFT) 50 MG tablet Take 100 mg by mouth 2 (two) times daily.    Yes Historical Provider, MD  UNABLE TO FIND Apply topically daily. Med Name: Zinc oxide/menthol   Yes Historical Provider, MD  atorvastatin (LIPITOR) 40 MG tablet Take 1 tablet (40 mg total) by mouth daily at 6 PM. Patient not taking: Reported on 07/03/2016 06/24/16   Janece Canterbury, MD   Allergies  Allergen Reactions  . Risperidone And Related Other (See Comments)    Makes him Manic     FAMILY HISTORY:  family history includes Aortic dissection in his father; Fibromyalgia in his sister; Hyperlipidemia in his sister; Hypertension in his mother and sister; Prostate cancer in his father. SOCIAL HISTORY:  reports that he has been smoking Cigarettes.  He has been smoking about 2.00 packs per day. He has quit using smokeless  tobacco. He reports that he uses drugs, including Marijuana. He reports that he does not drink alcohol.  REVIEW OF SYSTEMS:   Bolds are positive  Constitutional: weight loss, gain, night sweats, Fevers, chills, fatigue .  HEENT: headaches, Sore throat, sneezing, nasal congestion, post nasal drip, Difficulty swallowing, Tooth/dental problems, visual complaints visual changes, ear ache CV:  chest pain, radiates: ,Orthopnea, PND, swelling in lower extremities, dizziness, palpitations, syncope.  GI  heartburn, indigestion, abdominal pain, nausea, vomiting, diarrhea, change in bowel habits, loss of appetite, bloody stools.  Resp: cough, productive: , hemoptysis, dyspnea, chest pain, pleuritic.  Skin: rash or itching or icterus GU: dysuria, change in color of urine, urgency or frequency. flank pain, hematuria  MS: joint pain or swelling. decreased range of motion  Psych: change in mood or affect. depression or anxiety.  Neuro: difficulty with speech, weakness, numbness, ataxia   SUBJECTIVE:   VITAL SIGNS: Temp:  [98.4 F (36.9 C)] 98.4 F (36.9 C) (11/29 1623) Pulse Rate:  [64-103] 71 (11/29 2315) Resp:  [17-30] 18 (11/29 2315) BP: (98-130)/(60-85) 108/68 (11/29 2315) SpO2:  [92 %-100 %] 100 % (11/29 2315) Weight:  [79.4 kg (175 lb)] 79.4 kg (175 lb) (11/29 1624)  PHYSICAL EXAMINATION: General:  Male who appears older than stated age, very thin Neuro:  Alert, oriented, nonfocal HEENT:  Normocephalic, atraumatic, PERRL, no JVD Cardiovascular:  RRR,  no MRG, no peripheral edema Lungs:  Scattered rhonchi Abdomen:  Soft, nontender, nondistended Musculoskeletal:  No acute deformity or ROM limitation Skin:  Grossly intact    Recent Labs Lab 07/03/16 1714  NA 136  K 4.2  CL 100*  CO2 30  BUN 15  CREATININE 0.75  GLUCOSE 118*    Recent Labs Lab 07/03/16 1714  HGB 13.3  HCT 40.0  WBC 16.6*  PLT 294   Dg Chest Port 1 View  Result Date: 07/03/2016 CLINICAL DATA:  Follow-up  left-sided chest tube placement. Subsequent encounter. EXAM: PORTABLE CHEST 1 VIEW COMPARISON:  Chest radiograph performed earlier today at 7:12 p.m., and CT of the chest performed 06/22/2016 FINDINGS: The patient's small left apical pneumothorax has increased mildly in size from the prior study. A left-sided chest tube is unchanged in appearance. Patchy bilateral airspace opacity may reflect atypical or viral pneumonia, as noted on prior CT, similar in appearance to the prior study. No pleural effusion is seen. The cardiomediastinal silhouette remains normal in size. No acute osseous abnormalities are identified. IMPRESSION: 1. Small left apical pneumothorax has increased mildly in size since the prior study. Stable appearance to left-sided chest tube. 2. Persistent bilateral patchy airspace opacity may reflect atypical or viral pneumonia, as noted on prior CT. These results were called by telephone at the time of interpretation on 07/03/2016 at 10:44 pm to [place] RN in the [facility] ER, who verbally acknowledged these results. Electronically Signed   By: Garald Balding M.D.   On: 07/03/2016 22:44   Dg Chest Portable 1 View  Result Date: 07/03/2016 CLINICAL DATA:  Chest tube placement. EXAM: PORTABLE CHEST 1 VIEW COMPARISON:  07/03/2016 at 1629 hours FINDINGS: The cardiomediastinal silhouette is unchanged and within normal limits. A left-sided chest tube has been placed and terminates over the posterior sixth rib interspace. There is only a small residual left apical pneumothorax, greatly decreased from prior. Severe chronic chronic interstitial lung disease is again noted. No sizable pleural effusion is seen. IMPRESSION: Interval left chest tube placement with small residual pneumothorax. Electronically Signed   By: Logan Bores M.D.   On: 07/03/2016 19:28   Dg Chest Portable 1 View  Result Date: 07/03/2016 CLINICAL DATA:  Chest pain and shortness of breath. EXAM: PORTABLE CHEST 1 VIEW COMPARISON:   06/22/2016 FINDINGS: Moderate left-sided pneumothorax present of approximately 30-40% volume. Underlying severe chronic fibrotic lung disease with potential multifocal pneumonia again noted present. No significant component of pleural fluid. No significant shift of the mediastinal structures. The heart size is normal. IMPRESSION: Proximally 30-40% left pneumothorax. These results were called by telephone at the time of interpretation on 07/03/2016 at 4:43 pm to Dr. Marda Stalker , who verbally acknowledged these results. Electronically Signed   By: Aletta Edouard M.D.   On: 07/03/2016 16:47    ASSESSMENT / PLAN:  Left sided spontaneous pneumothorax - chest tube placed in ED, no air leak on pleur-e-vac. Re-expansion of lung on CXR - Chest tube to 20cm H2O suction. - Follow daily CXR  Chronic hypoxemic respiratory failure - etiology remains unclear. Thought to be acute pneumonitis/ILD. He is DNR so he is high risk for biopsy. Autoimmune workup to date has included C3, C4, histone antibodies, anti-DNA antibody, Anti-Smith, CCP, RF, ANCA, GBM, and ANA and has largely been negative with the exception of mildly elevated ESR, which is non-specific. He does not feel as though steroids have helped him. Doubt infectious etiology, leukocytosis likely due to chronic steroids. - Supplemental  O2 to keep O2 sats > 90% - Weaning from NRB to venti mask - Would continue preadmission steroids with plans to wean to off over the next two weeks as he has had no subjective benefit and O2 demands have not improved - Discontinue antibiotics   RLL nodule, Subcarinal mediastinal LAN - noted on CT last admit - Repeat CT scan in 3 months.   Georgann Housekeeper, AGACNP-BC [facility] Pulmonology/Critical Care Pager [phone] or [phone]  07/04/2016 12:05 AM

## 2016-07-03 NOTE — ED Provider Notes (Signed)
Jeffrey Ochoa   CSN: [id number] Arrival date & time: 07/03/16  1602     History   Chief Complaint Chief Complaint  Patient presents with  . Shortness of Breath  . Chest Pain    HPI Jeffrey Ochoa is a 60 y.o. male with past medical history significant for schizophrenia, hypertension, COPD, lung mass, and recent pneumonia 2 weeks ago who presents with 4 days of chest pain and shortness of breath and was found to have a pneumothorax in the left lung at his facility. Patient reports that his chest pain has resolved but he is continued to have shortness of breath requiring oxygen supplementation. He says that he had an x-ray today at his facility and was found to have a pneumothorax. The facility called the previous emergency physician and the plan outlined was to place a chest tube and send the patient back to his facility. Patient is reporting intermittent cough, continued shortness of breath, but no palpitations, lightheadedness, or hypotension.   She denies any headaches, vision changes, nausea, vomiting, constipation, diarrhea, dysuria. Denies any productive sputum but does report intermittent cough.  The history is provided by the patient and a relative. No language interpreter was used.  Shortness of Breath  This is a recurrent problem. The average episode lasts 4 days. The problem occurs continuously.The current episode started more than 2 days ago. The problem has not changed since onset.Associated symptoms include cough and chest pain (resolved). Pertinent negatives include no fever, no headaches, no rhinorrhea, no sputum production, no hemoptysis, no wheezing, no syncope, no vomiting, no abdominal pain, no rash, no leg pain and no leg swelling. He has tried nothing for the symptoms. The treatment provided no relief. Associated medical issues include COPD.    Past Medical History:  Diagnosis Date  . Chicken pox as a child  . HTN (hypertension) 12/05/2013  .  Hypertension   . Measles as a child  . Melanoma of back ([place])   . Mumps as a child  . Schizophrenia, paranoid ([place]) 1985  . Tobacco use disorder 12/05/2013   2 ppd  . Unspecified hereditary and idiopathic peripheral neuropathy 12/05/2013    Patient Active Problem List   Diagnosis Date Noted  . Pressure injury of skin 06/24/2016  . Leukocytosis 06/17/2016  . COPD exacerbation ([place]) 06/17/2016  . Respiratory failure with hypoxia ([place]) 06/16/2016  . Multifocal pneumonia 06/16/2016  . Other and unspecified hyperlipidemia 04/11/2014  . Lung mass 04/11/2014  . Unspecified vitamin D deficiency 04/11/2014  . Medicare annual wellness visit, subsequent 04/11/2014  . Tobacco use disorder 12/05/2013  . Unspecified hereditary and idiopathic peripheral neuropathy 12/05/2013  . HTN (hypertension) 12/05/2013  . Schizophrenia, paranoid ([place])   . Melanoma of back [facility])     Past Surgical History:  Procedure Laterality Date  . CHOLECYSTECTOMY  1985  . MOHS SURGERY     back  . MULTIPLE TOOTH EXTRACTIONS     has all teeth removed  . WISDOM TOOTH EXTRACTION         Home Medications    Prior to Admission medications   Medication Sig Start Date End Date Taking? Authorizing Provider  albuterol (PROVENTIL) (2.5 MG/3ML) 0.083% nebulizer solution Take 3 mLs (2.5 mg total) by nebulization 3 (three) times daily. 06/24/16   Janece Canterbury, MD  aspirin EC 81 MG EC tablet Take 1 tablet (81 mg total) by mouth daily. 06/25/16   Janece Canterbury, MD  atenolol (TENORMIN) 25 MG tablet Take 25 mg  by mouth 2 (two) times daily.    Historical Provider, MD  atorvastatin (LIPITOR) 40 MG tablet Take 1 tablet (40 mg total) by mouth daily at 6 PM. 06/24/16   Janece Canterbury, MD  benztropine (COGENTIN) 2 MG tablet Take 2 mg by mouth 2 (two) times daily.     Historical Provider, MD  bisacodyl (DULCOLAX) 10 MG suppository Place 1 suppository (10 mg total) rectally daily as needed for mild constipation or moderate  constipation. 06/24/16   Janece Canterbury, MD  budesonide (PULMICORT) 0.25 MG/2ML nebulizer solution Take 2 mLs (0.25 mg total) by nebulization 2 (two) times daily. 06/24/16   Janece Canterbury, MD  busPIRone (BUSPAR) 10 MG tablet Take 10 mg by mouth 3 (three) times daily.    Historical Provider, MD  clonazePAM (KLONOPIN) 1 MG tablet Take 1 tablet (1 mg total) by mouth 2 (two) times daily. 06/24/16   Janece Canterbury, MD  cyclobenzaprine (FLEXERIL) 5 MG tablet Take 1 tablet (5 mg total) by mouth 3 (three) times daily as needed for muscle spasms. 06/24/16   Janece Canterbury, MD  fluPHENAZine decanoate (PROLIXIN) 25 MG/ML injection Inject 75 mg into the muscle every 14 (fourteen) days.    Historical Provider, MD  ipratropium-albuterol (DUONEB) 0.5-2.5 (3) MG/3ML SOLN Take 3 mLs by nebulization every 2 (two) hours as needed. 06/24/16   Janece Canterbury, MD  methylPREDNISolone sodium succinate (SOLU-MEDROL) 125 mg/2 mL injection Inject 0.96 mLs (60 mg total) into the vein 2 (two) times daily. 06/24/16   Janece Canterbury, MD  mouth rinse LIQD solution 15 mLs by Mouth Rinse route 2 (two) times daily. 06/24/16   Janece Canterbury, MD  nicotine (NICODERM CQ - DOSED IN MG/24 HOURS) 21 mg/24hr patch Place 1 patch (21 mg total) onto the skin daily. 06/25/16   Janece Canterbury, MD  oxymetazoline (AFRIN) 0.05 % nasal spray Place 1 spray into both nostrils 2 (two) times daily as needed for congestion. 06/24/16   Janece Canterbury, MD  polyethylene glycol [facility] / GLYCOLAX) packet Take 17 g by mouth daily. 06/25/16   Janece Canterbury, MD  senna (SENOKOT) 8.6 MG TABS tablet Take 2 tablets (17.2 mg total) by mouth at bedtime. 06/24/16   Janece Canterbury, MD  sertraline (ZOLOFT) 50 MG tablet Take 100 mg by mouth 2 (two) times daily.     Historical Provider, MD  sodium chloride (OCEAN) 0.65 % SOLN nasal spray Place 1 spray into both nostrils as needed for congestion. 06/24/16   Janece Canterbury, MD  tiotropium (SPIRIVA) 18 MCG  inhalation capsule Place 18 mcg into inhaler and inhale daily.    Historical Provider, MD    Family History Family History  Problem Relation Age of Onset  . Hypertension Mother   . Prostate cancer Father   . Aortic dissection Father   . Fibromyalgia Sister   . Hypertension Sister   . Hyperlipidemia Sister     Social History Social History  Substance Use Topics  . Smoking status: Current Every Day Smoker    Packs/day: 2.00    Types: Cigarettes  . Smokeless tobacco: Former Systems developer  . Alcohol use No     Allergies   Risperidone and related   Review of Systems Review of Systems  Constitutional: Negative for chills, diaphoresis, fatigue and fever.  HENT: Negative for congestion and rhinorrhea.   Eyes: Negative for visual disturbance.  Respiratory: Positive for cough and shortness of breath. Negative for hemoptysis, sputum production, chest tightness, wheezing and stridor.   Cardiovascular: Positive for chest pain (  resolved). Negative for leg swelling and syncope.  Gastrointestinal: Negative for abdominal distention, abdominal pain, constipation, diarrhea, nausea and vomiting.  Genitourinary: Negative for dysuria, flank pain and frequency.  Musculoskeletal: Negative for back pain.  Skin: Negative for rash.  Neurological: Negative for headaches.  Psychiatric/Behavioral: Negative for agitation.  All other systems reviewed and are negative.    Physical Exam Updated Vital Signs BP 138/79   Pulse 70   Temp 98.4 F (36.9 C) (Oral)   Resp 20   Ht 6\' 7"  (2.007 m)   Wt 175 lb (79.4 kg)   SpO2 98%   BMI 19.71 kg/m   Physical Exam  Constitutional: He appears well-developed and well-nourished.  HENT:  Head: Normocephalic and atraumatic.  Mouth/Throat: Oropharynx is clear and moist. No oropharyngeal exudate.  Eyes: Conjunctivae and EOM are normal. Pupils are equal, round, and reactive to light.  Neck: Normal range of motion. Neck supple.  Cardiovascular: Normal rate and  regular rhythm.   No murmur heard. Pulmonary/Chest: Tachypnea noted. No respiratory distress. He has decreased breath sounds in the left upper field, the left middle field and the left lower field. He has no wheezes. He has no rales. He exhibits no tenderness.  Abdominal: Soft. There is no tenderness.  Musculoskeletal: He exhibits no edema.  Neurological: He is alert.  Skin: Skin is warm and dry.  Psychiatric: He has a normal mood and affect.  Nursing Ochoa and vitals reviewed.    ED Treatments / Results  Labs (all labs ordered are listed, but only abnormal results are displayed) Labs Reviewed  CBC WITH DIFFERENTIAL/PLATELET - Abnormal; Notable for the following:       Result Value   WBC 16.6 (*)    Neutro Abs 14.6 (*)    All other components within normal limits  BASIC METABOLIC PANEL - Abnormal; Notable for the following:    Chloride 100 (*)    Glucose, Bld 118 (*)    Calcium 8.6 (*)    All other components within normal limits  LACTATE DEHYDROGENASE, BODY FLUID - Abnormal; Notable for the following:    LD, Fluid 1,362 (*)    All other components within normal limits  BODY FLUID CELL COUNT WITH DIFFERENTIAL - Abnormal; Notable for the following:    Color, Fluid RED (*)    Appearance, Fluid TURBID (*)    Neutrophil Count, Fluid 86 (*)    Monocyte-Macrophage-Serous Fluid 5 (*)    All other components within normal limits  BODY FLUID CULTURE  PROTIME-INR  PROTEIN, BODY FLUID  GLUCOSE, SEROUS FLUID  ADENOSIDE DEAMINASE, PLEURAL FL  PH, BODY FLUID  PREALBUMIN  MAGNESIUM  PHOSPHORUS  TSH  COMPREHENSIVE METABOLIC PANEL  CBC  I-STAT TROPOININ, ED  I-STAT CG4 LACTIC ACID, ED  I-STAT TROPOININ, ED  I-STAT CG4 LACTIC ACID, ED  I-STAT TROPOININ, ED  CYTOLOGY - NON PAP    EKG  EKG Interpretation  Date/Time:  Wednesday July 03 2016 16:14:33 EST Ventricular Rate:  77 PR Interval:    QRS Duration: 100 QT Interval:  404 QTC Calculation: 458 R Axis:   -51 Text  Interpretation:  Sinus rhythm LAE, consider biatrial enlargement Left axis deviation ST elevation, consider inferior injury When compared to prior, slightly sharper T waves in V3 No STEMI Abnormal ECG Confirmed by Sherry Ruffing MD, CHRISTOPHER [phone]) on 07/03/2016 11:45:47 PM       Radiology Dg Chest Port 1 View  Result Date: 07/03/2016 CLINICAL DATA:  Follow-up left-sided chest tube placement. Subsequent encounter. EXAM:  PORTABLE CHEST 1 VIEW COMPARISON:  Chest radiograph performed earlier today at 7:12 p.m., and CT of the chest performed 06/22/2016 FINDINGS: The patient's small left apical pneumothorax has increased mildly in size from the prior study. A left-sided chest tube is unchanged in appearance. Patchy bilateral airspace opacity may reflect atypical or viral pneumonia, as noted on prior CT, similar in appearance to the prior study. No pleural effusion is seen. The cardiomediastinal silhouette remains normal in size. No acute osseous abnormalities are identified. IMPRESSION: 1. Small left apical pneumothorax has increased mildly in size since the prior study. Stable appearance to left-sided chest tube. 2. Persistent bilateral patchy airspace opacity may reflect atypical or viral pneumonia, as noted on prior CT. These results were called by telephone at the time of interpretation on 07/03/2016 at 10:44 pm to [place] RN in the [facility] ER, who verbally acknowledged these results. Electronically Signed   By: Garald Balding M.D.   On: 07/03/2016 22:44   Dg Chest Portable 1 View  Result Date: 07/03/2016 CLINICAL DATA:  Chest tube placement. EXAM: PORTABLE CHEST 1 VIEW COMPARISON:  07/03/2016 at 1629 hours FINDINGS: The cardiomediastinal silhouette is unchanged and within normal limits. A left-sided chest tube has been placed and terminates over the posterior sixth rib interspace. There is only a small residual left apical pneumothorax, greatly decreased from prior. Severe chronic chronic interstitial  lung disease is again noted. No sizable pleural effusion is seen. IMPRESSION: Interval left chest tube placement with small residual pneumothorax. Electronically Signed   By: Logan Bores M.D.   On: 07/03/2016 19:28   Dg Chest Portable 1 View  Result Date: 07/03/2016 CLINICAL DATA:  Chest pain and shortness of breath. EXAM: PORTABLE CHEST 1 VIEW COMPARISON:  06/22/2016 FINDINGS: Moderate left-sided pneumothorax present of approximately 30-40% volume. Underlying severe chronic fibrotic lung disease with potential multifocal pneumonia again noted present. No significant component of pleural fluid. No significant shift of the mediastinal structures. The heart size is normal. IMPRESSION: Proximally 30-40% left pneumothorax. These results were called by telephone at the time of interpretation on 07/03/2016 at 4:43 pm to Dr. Marda Stalker , who verbally acknowledged these results. Electronically Signed   By: Aletta Edouard M.D.   On: 07/03/2016 16:47    Procedures Procedures (including critical care time)  Medications Ordered in ED Medications  morphine 4 MG/ML injection 4 mg (not administered)  albuterol (PROVENTIL) (2.5 MG/3ML) 0.083% nebulizer solution 2.5 mg (not administered)  atorvastatin (LIPITOR) tablet 40 mg (not administered)  bisacodyl (DULCOLAX) suppository 10 mg (not administered)  budesonide (PULMICORT) nebulizer solution 0.25 mg (not administered)  busPIRone (BUSPAR) tablet 10 mg (not administered)  clonazePAM (KLONOPIN) tablet 1 mg (1 mg Oral Given 07/04/16 0112)  cyclobenzaprine (FLEXERIL) tablet 5 mg (not administered)  ipratropium-albuterol (DUONEB) 0.5-2.5 (3) MG/3ML nebulizer solution 3 mL (not administered)  polyethylene glycol (MIRALAX / GLYCOLAX) packet 17 g (not administered)  senna (SENOKOT) tablet 17.2 mg (not administered)  benztropine (COGENTIN) tablet 2 mg (2 mg Oral Given 07/04/16 0112)  sertraline (ZOLOFT) tablet 100 mg (100 mg Oral Given 07/04/16 0112)    atenolol (TENORMIN) tablet 25 mg (25 mg Oral Given 07/04/16 0112)  sodium chloride flush (NS) 0.9 % injection 3 mL (3 mLs Intravenous Given 07/04/16 0113)  acetaminophen (TYLENOL) tablet 650 mg (not administered)    Or  acetaminophen (TYLENOL) suppository 650 mg (not administered)  HYDROcodone-acetaminophen (NORCO/VICODIN) 5-325 MG per tablet 1-2 tablet (not administered)  ondansetron (ZOFRAN) tablet 4 mg (not administered)  Or  ondansetron [facility]) injection 4 mg (not administered)  0.9 %  sodium chloride infusion ( Intravenous New Bag/Given 07/04/16 0113)  ipratropium (ATROVENT) nebulizer solution 0.5 mg (not administered)  dexamethasone (DECADRON) tablet 4 mg (not administered)  lidocaine-EPINEPHrine (XYLOCAINE W/EPI) 2 %-1:200000 (PF) injection 10 mL (10 mLs Intradermal Given 07/03/16 1748)  morphine 4 MG/ML injection 4 mg (4 mg Intravenous Given 07/03/16 1824)  morphine 4 MG/ML injection 4 mg (4 mg Intravenous Given 07/03/16 1949)  ceFEPIme (MAXIPIME) 1 g in dextrose 5 % 50 mL IVPB (0 g Intravenous Stopped 07/03/16 2051)  vancomycin (VANCOCIN) 1,500 mg in sodium chloride 0.9 % 500 mL IVPB (0 mg Intravenous Stopped 07/03/16 2319)     Initial Impression / Assessment and Plan / ED Course  I have reviewed the triage vital signs and the nursing notes.  Pertinent labs & imaging results that were available during my care of the patient were reviewed by me and considered in my medical decision making (see chart for details).  Clinical Course     Jeffrey Ochoa is a 60 y.o. male with past medical history significant for schizophrenia, hypertension, COPD, lung mass, and recent pneumonia 2 weeks ago who presents with 4 days of chest pain and shortness of breath and was found to have a pneumothorax in the left lung at his facility.    history and exam are seen above.  On exam, patient is decreased breath sounds on the left. No chest tenderness. No abdominal tenderness. Unremarkable  neurologic exam.  Given patient's report of chest pain and shortness of breath requiring oxygen and his recent pneumonia, visual have laboratory testing and x-ray to look for pneumonia as well as confirm his thorax.   Patient will have a chest tube placed and based on laboratory testing and condition of patient, decision will be made as to if patient is stable for discharge back to his facility versus requiring admission for possible continued pneumonia.  Chest tube was placed by emergency team, please see dedicated procedure Ochoa for procedure.  Post chest tube x-ray showed small residual apical pneumothorax. Patient symptomatically felt better with his breathing. He remained on high flow oxygen.  Given concern for chest x-ray showing multifocal pneumonia, elevated blood cell count, and reported cough, clinical suspicion for pneumonia. Patient given antibiotics. As patient has residual mild pneumothorax and concern for pneumonia, patient will be admitted to hospitalist service for further management.  Nursing called to inform that patient had moved in the bed and his site began bleeding. It was reexamined and tube was resecured. X-ray was again obtained showing slightly enlarged pneumothorax. Tube was still draining fluid and air, tube still in correct positioning. Given slightly worsened pneumothorax, again feel patient is not appropriate for discharge back to facility. Patient will be admitted for further management.    Final Clinical Impressions(s) / ED Diagnoses   Final diagnoses:  Status post chest tube placement [facility])     Clinical Impression: 1. Status post chest tube placement [facility])     Disposition: Admit to Hospitalist service    Courtney Paris, MD 07/04/16 [phone]

## 2016-07-03 NOTE — ED Notes (Signed)
Si Raider-- Case Manager at [facility]-- [phone]

## 2016-07-04 ENCOUNTER — Encounter (HOSPITAL_COMMUNITY): Payer: Self-pay | Admitting: General Practice

## 2016-07-04 ENCOUNTER — Inpatient Hospital Stay (HOSPITAL_COMMUNITY): Payer: Medicare Other

## 2016-07-04 DIAGNOSIS — J9601 Acute respiratory failure with hypoxia: Secondary | ICD-10-CM | POA: Diagnosis not present

## 2016-07-04 DIAGNOSIS — J849 Interstitial pulmonary disease, unspecified: Secondary | ICD-10-CM | POA: Diagnosis not present

## 2016-07-04 DIAGNOSIS — J189 Pneumonia, unspecified organism: Secondary | ICD-10-CM | POA: Diagnosis not present

## 2016-07-04 LAB — CBC
HEMATOCRIT: 42.8 % (ref 39.0–52.0)
HEMOGLOBIN: 13.6 g/dL (ref 13.0–17.0)
MCH: 30.8 pg (ref 26.0–34.0)
MCHC: 31.8 g/dL (ref 30.0–36.0)
MCV: 96.8 fL (ref 78.0–100.0)
Platelets: 273 10*3/uL (ref 150–400)
RBC: 4.42 MIL/uL (ref 4.22–5.81)
RDW: 14.6 % (ref 11.5–15.5)
WBC: 16.7 10*3/uL — AB (ref 4.0–10.5)

## 2016-07-04 LAB — BODY FLUID CELL COUNT WITH DIFFERENTIAL
LYMPHS FL: 9 %
Monocyte-Macrophage-Serous Fluid: 5 % — ABNORMAL LOW (ref 50–90)
Neutrophil Count, Fluid: 86 % — ABNORMAL HIGH (ref 0–25)
Total Nucleated Cell Count, Fluid: 107 cu mm (ref 0–1000)

## 2016-07-04 LAB — COMPREHENSIVE METABOLIC PANEL
ALBUMIN: 3 g/dL — AB (ref 3.5–5.0)
ALT: 165 U/L — ABNORMAL HIGH (ref 17–63)
ANION GAP: 6 (ref 5–15)
AST: 146 U/L — AB (ref 15–41)
Alkaline Phosphatase: 216 U/L — ABNORMAL HIGH (ref 38–126)
BILIRUBIN TOTAL: 0.4 mg/dL (ref 0.3–1.2)
BUN: 15 mg/dL (ref 6–20)
CHLORIDE: 100 mmol/L — AB (ref 101–111)
CO2: 30 mmol/L (ref 22–32)
Calcium: 8.4 mg/dL — ABNORMAL LOW (ref 8.9–10.3)
Creatinine, Ser: 0.73 mg/dL (ref 0.61–1.24)
GFR calc Af Amer: >60 mL/min (ref >60)
GFR calc non Af Amer: >60 mL/min (ref >60)
GLUCOSE: 95 mg/dL (ref 65–99)
POTASSIUM: 3.9 mmol/L (ref 3.5–5.1)
Sodium: 136 mmol/L (ref 135–145)
TOTAL PROTEIN: 5.8 g/dL — AB (ref 6.5–8.1)

## 2016-07-04 LAB — MRSA PCR SCREENING: MRSA by PCR: NEGATIVE

## 2016-07-04 LAB — PREALBUMIN: Prealbumin: 32.9 mg/dL (ref 18–38)

## 2016-07-04 LAB — PHOSPHORUS: PHOSPHORUS: 3.9 mg/dL (ref 2.5–4.6)

## 2016-07-04 LAB — LACTATE DEHYDROGENASE: LDH: 340 U/L — ABNORMAL HIGH (ref 98–192)

## 2016-07-04 LAB — MAGNESIUM: MAGNESIUM: 2.2 mg/dL (ref 1.7–2.4)

## 2016-07-04 LAB — I-STAT TROPONIN, ED: Troponin i, poc: 0 ng/mL (ref 0.00–0.08)

## 2016-07-04 LAB — TSH: TSH: 3.95 u[IU]/mL (ref 0.350–4.500)

## 2016-07-04 LAB — I-STAT CG4 LACTIC ACID, ED: LACTIC ACID, VENOUS: 0.64 mmol/L (ref 0.5–1.9)

## 2016-07-04 MED ORDER — ATENOLOL 25 MG PO TABS
25.0000 mg | ORAL_TABLET | Freq: Two times a day (BID) | ORAL | Status: DC
  Administered 2016-07-04 (×2): 25 mg via ORAL
  Filled 2016-07-04 (×2): qty 1

## 2016-07-04 MED ORDER — ACETAMINOPHEN 650 MG RE SUPP
650.0000 mg | Freq: Four times a day (QID) | RECTAL | Status: DC | PRN
Start: 2016-07-04 — End: 2016-07-04

## 2016-07-04 MED ORDER — IPRATROPIUM-ALBUTEROL 0.5-2.5 (3) MG/3ML IN SOLN
3.0000 mL | Freq: Three times a day (TID) | RESPIRATORY_TRACT | Status: DC
  Administered 2016-07-04 (×2): 3 mL via RESPIRATORY_TRACT
  Filled 2016-07-04 (×2): qty 3

## 2016-07-04 MED ORDER — MORPHINE SULFATE (PF) 4 MG/ML IV SOLN
4.0000 mg | INTRAVENOUS | Status: DC | PRN
  Administered 2016-07-04: 4 mg via INTRAVENOUS
  Filled 2016-07-04: qty 1

## 2016-07-04 MED ORDER — TRANEXAMIC ACID 1000 MG/10ML IV SOLN
500.0000 mg | Freq: Once | INTRAVENOUS | Status: AC
  Administered 2016-07-04: 500 mg via TOPICAL
  Filled 2016-07-04: qty 10

## 2016-07-04 MED ORDER — SERTRALINE HCL 100 MG PO TABS
100.0000 mg | ORAL_TABLET | Freq: Two times a day (BID) | ORAL | Status: DC
  Administered 2016-07-04 (×2): 100 mg via ORAL
  Filled 2016-07-04: qty 1
  Filled 2016-07-04: qty 2

## 2016-07-04 MED ORDER — ENSURE ENLIVE PO LIQD: 237.0000 mL | Freq: Two times a day (BID) | ORAL | Status: DC

## 2016-07-04 MED ORDER — SODIUM CHLORIDE 0.9 % IV SOLN
INTRAVENOUS | Status: AC
  Administered 2016-07-04: 01:00:00 via INTRAVENOUS

## 2016-07-04 MED ORDER — BUDESONIDE 0.5 MG/2ML IN SUSP
0.2500 mg | Freq: Two times a day (BID) | RESPIRATORY_TRACT | Status: DC
  Administered 2016-07-04: 0.25 mg via RESPIRATORY_TRACT
  Filled 2016-07-04: qty 2

## 2016-07-04 MED ORDER — ACETAMINOPHEN 325 MG PO TABS: 650.0000 mg | ORAL_TABLET | Freq: Four times a day (QID) | ORAL | Status: DC | PRN

## 2016-07-04 MED ORDER — BENZTROPINE MESYLATE 2 MG PO TABS
2.0000 mg | ORAL_TABLET | Freq: Two times a day (BID) | ORAL | Status: DC
  Administered 2016-07-04 (×2): 2 mg via ORAL
  Filled 2016-07-04: qty 1
  Filled 2016-07-04: qty 2

## 2016-07-04 MED ORDER — BISACODYL 10 MG RE SUPP: 10.0000 mg | Freq: Every day | RECTAL | Status: DC | PRN

## 2016-07-04 MED ORDER — IPRATROPIUM-ALBUTEROL 0.5-2.5 (3) MG/3ML IN SOLN
3.0000 mL | Freq: Four times a day (QID) | RESPIRATORY_TRACT | Status: DC
  Administered 2016-07-04: 3 mL via RESPIRATORY_TRACT
  Filled 2016-07-04: qty 3

## 2016-07-04 MED ORDER — CYCLOBENZAPRINE HCL 10 MG PO TABS: 5.0000 mg | ORAL_TABLET | Freq: Three times a day (TID) | ORAL | Status: DC | PRN

## 2016-07-04 MED ORDER — ALBUTEROL SULFATE (2.5 MG/3ML) 0.083% IN NEBU: 2.5000 mg | INHALATION_SOLUTION | Freq: Three times a day (TID) | RESPIRATORY_TRACT | Status: DC

## 2016-07-04 MED ORDER — BUSPIRONE HCL 5 MG PO TABS
10.0000 mg | ORAL_TABLET | Freq: Three times a day (TID) | ORAL | Status: DC
  Administered 2016-07-04: 10 mg via ORAL
  Filled 2016-07-04: qty 2

## 2016-07-04 MED ORDER — ATORVASTATIN CALCIUM 40 MG PO TABS: 40.0000 mg | ORAL_TABLET | Freq: Every day | ORAL | Status: DC

## 2016-07-04 MED ORDER — HYDROCODONE-ACETAMINOPHEN 5-325 MG PO TABS: 1.0000 | ORAL_TABLET | ORAL | Status: DC | PRN

## 2016-07-04 MED ORDER — IPRATROPIUM BROMIDE 0.02 % IN SOLN: 0.5000 mg | Freq: Four times a day (QID) | RESPIRATORY_TRACT | Status: DC

## 2016-07-04 MED ORDER — CLONAZEPAM 1 MG PO TABS
1.0000 mg | ORAL_TABLET | Freq: Two times a day (BID) | ORAL | Status: DC
  Administered 2016-07-04 (×2): 1 mg via ORAL
  Filled 2016-07-04: qty 1
  Filled 2016-07-04: qty 2

## 2016-07-04 MED ORDER — ONDANSETRON HCL 4 MG/2ML IJ SOLN: 4.0000 mg | Freq: Four times a day (QID) | INTRAMUSCULAR | Status: DC | PRN

## 2016-07-04 MED ORDER — POLYETHYLENE GLYCOL 3350 17 G PO PACK
17.0000 g | PACK | Freq: Every day | ORAL | Status: DC
  Filled 2016-07-04: qty 1

## 2016-07-04 MED ORDER — SODIUM CHLORIDE 0.9% FLUSH
3.0000 mL | Freq: Two times a day (BID) | INTRAVENOUS | Status: DC
  Administered 2016-07-04 (×2): 3 mL via INTRAVENOUS

## 2016-07-04 MED ORDER — ONDANSETRON HCL 4 MG PO TABS: 4.0000 mg | ORAL_TABLET | Freq: Four times a day (QID) | ORAL | Status: DC | PRN

## 2016-07-04 MED ORDER — SENNA 8.6 MG PO TABS
2.0000 | ORAL_TABLET | Freq: Every day | ORAL | Status: DC
  Administered 2016-07-04: 17.2 mg via ORAL
  Filled 2016-07-04: qty 2

## 2016-07-04 NOTE — ED Notes (Signed)
Pt given snack pack and coke

## 2016-07-04 NOTE — Discharge Summary (Signed)
Physician Discharge Summary  DOYCE SALING [id number]:[id number] DOB: October 22, 1955 DOA: 07/03/2016  PCP: Penni Homans, MD  Admit date: 07/03/2016 Discharge date: 07/04/2016  Admitted From: [facility] Disposition: [facility]   Recommendations for Outpatient Follow-up:  1. Follow up chest tube placed 11/29 2. Monitor CXR daily 3. Recommend repeat CT scan in 3 months to follow up RLL nodule and subcarinal lymphadenopathy 4. Please follow up on the following pending results: pleural fluid culture and cytology (discussed with case manager who will ensure these results are received at [facility])  Discharge Condition: Stable CODE STATUS: DNR Diet recommendation: Heart Healthy  Brief/Interim Summary: Jeffrey Ochoa is a 60 y.o. male with a history of COPD on chronic O2, hyponatremia, Schizophrenia, HTN, leukocytosis, CHF with diastolic dysfunction, recent pneumonia and idiopathic underlying lung disease.    He presented 11/29 with 4 days of chest pain and shortness of breath and was found to have a pneumothorax in the left lung at his facility. Patient reports that his chest pain has resolved but he is continued to have shortness of breath requiring oxygen supplementation. Per [facility] medical provider, the plan outlined was to place a chest tube and send the patient back to his facility. In the ED, he was afebrile,  Temp (24hrs), RR 23 O2 sat 99% on NR 15L HR 76 BP 117/74, WBC 16.6 down from 22.7, Cr 0.75.   CXR confirmed 30-40% pneumothorax. Chest tube was placed by EDP with improvement. Pulmonology has consulted and the patient has remained stable for discharge. Cardiothoracic surgery was consulted at admission. After discussion of the case with Dr. Servando Snare, no surgical intervention is planned and he does not need to see the patient prior to discharge. See below for further assessment/plan.   Discharge Diagnoses:  Active Problems:   HTN (hypertension)   Lung mass   Respiratory failure with  hypoxia (HCC)   Multifocal pneumonia   Leukocytosis   Pneumothorax   Acute respiratory failure with hypoxia (HCC)  Acute on chronic hypoxemic respiratory failure: secondary to pneumothorax as sequela of emphysema, improved s/p left chest tube. Weaned NRB > ventimask, saturating 100%, can wean to nasal cannula today. Cause of chronic respiratory failure less clear, though to be acute pneumonitis/ILD. Per pulmonology, he is DNR so he is high risk for biopsy. Autoimmune workup to date has included C3, C4, histone antibodies, anti-DNA antibody, Anti-Smith, CCP, RF, ANCA, GBM, and ANA and has largely been negative with the exception of mildly elevated ESR, which is non-specific. Could also represent an infiltrative malignant process such as lymphoma.  - Chest tube to 20cm H2O suction, mild apical PTX persists: Follow daily CXR - Discussed with Dr. Claybon Jabs at [facility] who reports they can accept him back and manage chest tube.  - Continued respiratory medications - Recommend continued steroids with gradual taper  Exudative pleural fluid (studies below) - Awaiting pleural fluid cytology and culture. Possibly malignant with lymphoma of primary concern given patient's previous subcarinal mediastinal adenopathy.  Leukocytosis: Thought to be reactive to steroids and not infectious process.   SEROLOGIES: Anti-CCP: 5 Anti-GBM: Negative  RF:  13.4 ANA:  Negative MPO:  <9.0 DS DNA Ab:  <1 Histone Ab:  0.4 Smith Ab:  <0.2 C3:  123 C4:  28  LEFT PLEURAL FLUID (07/03/16): Glucose:  96 LDH:  1362 Total Protein:  <3.0 WBC:  107 (9% lymph, 86% neutro, 5% macro/mono) Ctx >>> Cytology >>>  Discharge Instructions    Medication List    TAKE these medications  aspirin 81 MG EC tablet Take 1 tablet (81 mg total) by mouth daily.   atenolol 25 MG tablet Commonly known as:  TENORMIN Take 25 mg by mouth 2 (two) times daily.   atorvastatin 40 MG tablet Commonly known as:  LIPITOR Take 1 tablet  (40 mg total) by mouth daily at 6 PM.   benztropine 2 MG tablet Commonly known as:  COGENTIN Take 2 mg by mouth 2 (two) times daily.   busPIRone 10 MG tablet Commonly known as:  BUSPAR Take 10 mg by mouth 3 (three) times daily.   clonazePAM 1 MG tablet Commonly known as:  KLONOPIN Take 1 tablet (1 mg total) by mouth 2 (two) times daily.   dexamethasone 4 MG tablet Commonly known as:  DECADRON Take 4 mg by mouth 2 (two) times daily with a meal.   enoxaparin 40 MG/0.4ML injection Commonly known as:  LOVENOX Inject 40 mg into the skin daily.   ENSURE PLUS Liqd Take 237 mLs by mouth 2 (two) times daily between meals.   fluPHENAZine decanoate 25 MG/ML injection Commonly known as:  PROLIXIN Inject 75 mg into the muscle every 14 (fourteen) days.   ipratropium-albuterol 0.5-2.5 (3) MG/3ML Soln Commonly known as:  DUONEB Take 3 mLs by nebulization every 2 (two) hours as needed. What changed:  when to take this   nicotine 21 mg/24hr patch Commonly known as:  NICODERM CQ - dosed in mg/24 hours Place 1 patch (21 mg total) onto the skin daily.   polyethylene glycol packet Commonly known as:  MIRALAX / GLYCOLAX Take 17 g by mouth daily.   senna 8.6 MG Tabs tablet Commonly known as:  SENOKOT Take 2 tablets (17.2 mg total) by mouth at bedtime.   sertraline 50 MG tablet Commonly known as:  ZOLOFT Take 100 mg by mouth 2 (two) times daily.   UNABLE TO FIND Apply topically daily. Med Name: Zinc oxide/menthol       Allergies  Allergen Reactions  . Risperidone And Related Other (See Comments)    Makes him Manic     Consultations:  Cardiothoracic surgery, Dr. Servando Snare (discussed case, no formal consult)  Pulmonology, Dr. Kara Mead  Procedures/Studies: Ct Chest High Resolution  Result Date: 06/22/2016 CLINICAL DATA:  Cough, fever, shortness of breath and leukocytosis. Acute respiratory failure. EXAM: CT CHEST WITHOUT CONTRAST TECHNIQUE: Multidetector CT imaging of  the chest was performed following the standard protocol without intravenous contrast. High resolution imaging of the lungs, as well as inspiratory and expiratory imaging, was performed. COMPARISON:  Chest radiographs dating back to 04/05/2014. FINDINGS: Cardiovascular: Coronary artery calcification. Heart size normal. No pericardial effusion. Mediastinum/Nodes: Mediastinal lymph nodes are not enlarged by CT size criteria. Hilar regions are difficult to definitively evaluate without IV contrast. No axillary adenopathy. Esophagus is grossly unremarkable. Lungs/Pleura: There is fairly diffuse ground-glass with areas of mild subpleural consolidation. Findings are superimposed on centrilobular and likely paraseptal emphysema. Difficult to exclude a 7 mm nodule in the posterior right lower lobe (series 9, image 129). Trace left pleural effusion. Trace pleural fluid posteromedially in the inferior right hemi thorax. Upper Abdomen: Low-attenuation lesions in the liver measure up to 1.5 cm and are likely cysts although definitive characterization is limited due to size and lack of postcontrast imaging. Cholecystectomy. Visualized portions of the adrenal glands, kidneys, spleen, pancreas, stomach and bowel are grossly unremarkable. Musculoskeletal: No worrisome lytic or sclerotic lesions. Degenerative changes are seen in the spine. IMPRESSION: 1. Pulmonary parenchymal pattern of ground-glass and scattered  subpleural consolidation are likely acute given the clinical history, in which case, atypical or viral pneumonia is favored. 2. **An incidental finding of potential clinical significance has been found. Difficult to exclude a 7 mm right lower lobe nodule. Non-contrast chest CT at 6-12 months is recommended. If the nodule is stable at time of repeat CT, then future CT at 18-24 months (from today's scan) is considered optional for low-risk patients, but is recommended for high-risk patients. This recommendation follows the  consensus statement: Guidelines for Management of Incidental Pulmonary Nodules Detected on CT Images: From the Fleischner Society 2017; Radiology 2017; 284:228-243. ** 3. Trace bilateral pleural effusions, left greater than right. 4. Emphysema (ICD10-J43.9). 5. Aortic atherosclerosis (ICD10-170.0). Coronary artery calcification. Electronically Signed   By: Lorin Picket M.D.   On: 06/22/2016 15:42   Dg Chest Port 1 View  Result Date: 07/04/2016 CLINICAL DATA:  Pneumothorax. EXAM: PORTABLE CHEST 1 VIEW COMPARISON:  Radiograph of July 03, 2016. FINDINGS: Stable cardiomediastinal silhouette. Stable position of left-sided chest tube is noted with stable mild left apical pneumothorax. Stable coarse interstitial densities are noted throughout both lungs concerning for edema or atypical inflammation. Bony thorax is unremarkable. IMPRESSION: Left-sided chest tube remains with stable mild left apical pneumothorax. Stable bilateral interstitial lung opacities as described above. Electronically Signed   By: Marijo Conception, M.D.   On: 07/04/2016 09:14   Dg Chest Port 1 View  Result Date: 07/03/2016 CLINICAL DATA:  Follow-up left-sided chest tube placement. Subsequent encounter. EXAM: PORTABLE CHEST 1 VIEW COMPARISON:  Chest radiograph performed earlier today at 7:12 p.m., and CT of the chest performed 06/22/2016 FINDINGS: The patient's small left apical pneumothorax has increased mildly in size from the prior study. A left-sided chest tube is unchanged in appearance. Patchy bilateral airspace opacity may reflect atypical or viral pneumonia, as noted on prior CT, similar in appearance to the prior study. No pleural effusion is seen. The cardiomediastinal silhouette remains normal in size. No acute osseous abnormalities are identified. IMPRESSION: 1. Small left apical pneumothorax has increased mildly in size since the prior study. Stable appearance to left-sided chest tube. 2. Persistent bilateral patchy  airspace opacity may reflect atypical or viral pneumonia, as noted on prior CT. These results were called by telephone at the time of interpretation on 07/03/2016 at 10:44 pm to [place] RN in the [facility] ER, who verbally acknowledged these results. Electronically Signed   By: Garald Balding M.D.   On: 07/03/2016 22:44   Dg Chest Portable 1 View  Result Date: 07/03/2016 CLINICAL DATA:  Chest tube placement. EXAM: PORTABLE CHEST 1 VIEW COMPARISON:  07/03/2016 at 1629 hours FINDINGS: The cardiomediastinal silhouette is unchanged and within normal limits. A left-sided chest tube has been placed and terminates over the posterior sixth rib interspace. There is only a small residual left apical pneumothorax, greatly decreased from prior. Severe chronic chronic interstitial lung disease is again noted. No sizable pleural effusion is seen. IMPRESSION: Interval left chest tube placement with small residual pneumothorax. Electronically Signed   By: Logan Bores M.D.   On: 07/03/2016 19:28   Dg Chest Portable 1 View  Result Date: 07/03/2016 CLINICAL DATA:  Chest pain and shortness of breath. EXAM: PORTABLE CHEST 1 VIEW COMPARISON:  06/22/2016 FINDINGS: Moderate left-sided pneumothorax present of approximately 30-40% volume. Underlying severe chronic fibrotic lung disease with potential multifocal pneumonia again noted present. No significant component of pleural fluid. No significant shift of the mediastinal structures. The heart size is normal. IMPRESSION: Proximally  30-40% left pneumothorax. These results were called by telephone at the time of interpretation on 07/03/2016 at 4:43 pm to Dr. Marda Stalker , who verbally acknowledged these results. Electronically Signed   By: Aletta Edouard M.D.   On: 07/03/2016 16:47   Dg Chest Port 1 View  Result Date: 06/22/2016 CLINICAL DATA:  Dyspnea.  Hypertension. EXAM: PORTABLE CHEST 1 VIEW COMPARISON:  06/22/2016, 06/16/2016, 04/05/2014 FINDINGS: Airspace  opacities persist bilaterally, greatest in the left lower lobe. No significant change from the recent prior studies. Multifocal pneumonia is a leading consideration. No large effusions. IMPRESSION: No significant change in the multifocal airspace opacities. Electronically Signed   By: Andreas Newport M.D.   On: 06/22/2016 22:43   Dg Chest Port 1 View  Result Date: 06/22/2016 CLINICAL DATA:  Hypoxia, pneumonia EXAM: PORTABLE CHEST 1 VIEW COMPARISON:  06/19/2016 FINDINGS: Diffuse bilateral airspace opacities are again noted, left greater than right. Heart is normal size. No visible effusions or pneumothorax. No acute bony abnormality. IMPRESSION: Stable diffuse bilateral airspace opacities, left greater than right concerning for multifocal pneumonia. Electronically Signed   By: Rolm Baptise M.D.   On: 06/22/2016 07:49   Dg Chest Port 1 View  Result Date: 06/19/2016 CLINICAL DATA:  Pneumonia. EXAM: PORTABLE CHEST 1 VIEW COMPARISON:  06/18/2016 FINDINGS: The cardiomediastinal silhouette is within normal limits. Bilateral lung opacities, greatest in the left lower lobe, have not significantly changed from the prior study. No sizable pleural effusion or pneumothorax is identified. No acute osseous abnormality is seen. IMPRESSION: Unchanged bilateral lung opacities concerning for multifocal pneumonia. Electronically Signed   By: Logan Bores M.D.   On: 06/19/2016 07:28   Dg Chest Port 1 View  Result Date: 06/18/2016 CLINICAL DATA:  COPD with exacerbation EXAM: PORTABLE CHEST 1 VIEW COMPARISON:  06/16/2016 FINDINGS: Normal cardiac silhouette. There is fine airspace disease in the RIGHT upper lobe and LEFT lower lobe similar to comparison exam. No pneumothorax no pleural fluid evident. IMPRESSION: Bilateral airspace disease suggesting pulmonary edema versus multifocal pneumonia. No significant change. Electronically Signed   By: Suzy Bouchard M.D.   On: 06/18/2016 08:28   Dg Chest Port 1 View  Result  Date: 06/16/2016 CLINICAL DATA:  Sudden onset worsening shortness of breath beginning 3 days ago. Chills and diaphoresis. Decreased oxygenation. History of hypertension, melanoma, tobacco abuse. EXAM: PORTABLE CHEST 1 VIEW COMPARISON:  04/05/2014 FINDINGS: Normal heart size and pulmonary vascularity. Areas of airspace infiltration demonstrated in the right upper lung and left mid and lower lung. Peribronchial thickening. Changes suspicious for multifocal pneumonia. No pneumothorax. Mediastinal contours appear intact. IMPRESSION: Airspace infiltrates in the right upper lung and left lower lung suspicious for multifocal pneumonia. Electronically Signed   By: Lucienne Capers M.D.   On: 06/16/2016 04:17    Left chest tube insertion 11/29.   Subjective: Pt reports significant improvement in dyspnea, no chest pain. No other complaints.   Discharge Exam: Vitals:   07/04/16 0833 07/04/16 1018  BP:    Pulse:  70  Resp:    Temp: 97.7 F (36.5 C)    Vitals:   07/04/16 0833 07/04/16 0859 07/04/16 0900 07/04/16 1018  BP:      Pulse:    70  Resp:      Temp: 97.7 F (36.5 C)     TempSrc: Oral     SpO2:  97% 97%   Weight: 70.2 kg (154 lb 12.8 oz)     Height: 6' 7"  (2.007 m)  General: Elderly-appearing 60yo man not in acute distress Cardiovascular: RRR, S1/S2 +, no rubs, no gallops Respiratory: Nonlabored on ventimask speaking full sentences. Diminished left breath sounds to mid lung zone and scattered rhonchi. Abdominal: Soft, NT, ND, bowel sounds + Extremities: no edema, no cyanosis  The results of significant diagnostics from this hospitalization (including imaging, microbiology, ancillary and laboratory) are listed below for reference.    Microbiology: Recent Results (from the past 240 hour(s))  Body fluid culture     Status: None (Preliminary result)   Collection Time: 07/03/16 10:10 PM  Result Value Ref Range Status   Specimen Description FLUID  Final   Special Requests PLEURAL   Final   Gram Stain   Final    RARE WBC PRESENT, PREDOMINANTLY PMN NO ORGANISMS SEEN    Culture PENDING  Incomplete   Report Status PENDING  Incomplete     Labs: BNP (last 3 results)  Recent Labs  06/16/16 0346  BNP 956.3*   Basic Metabolic Panel:  Recent Labs Lab 07/03/16 1714 07/04/16 0106  NA 136 136  K 4.2 3.9  CL 100* 100*  CO2 30 30  GLUCOSE 118* 95  BUN 15 15  CREATININE 0.75 0.73  CALCIUM 8.6* 8.4*  MG  --  2.2  PHOS  --  3.9   Liver Function Tests:  Recent Labs Lab 07/04/16 0106  AST 146*  ALT 165*  ALKPHOS 216*  BILITOT 0.4  PROT 5.8*  ALBUMIN 3.0*   No results for input(s): LIPASE, AMYLASE in the last 168 hours. No results for input(s): AMMONIA in the last 168 hours. CBC:  Recent Labs Lab 07/03/16 1714 07/04/16 0106  WBC 16.6* 16.7*  NEUTROABS 14.6*  --   HGB 13.3 13.6  HCT 40.0 42.8  MCV 94.8 96.8  PLT 294 273   Cardiac Enzymes: No results for input(s): CKTOTAL, CKMB, CKMBINDEX, TROPONINI in the last 168 hours. BNP: Invalid input(s): POCBNP CBG: No results for input(s): GLUCAP in the last 168 hours. D-Dimer No results for input(s): DDIMER in the last 72 hours. Hgb A1c No results for input(s): HGBA1C in the last 72 hours. Lipid Profile No results for input(s): CHOL, HDL, LDLCALC, TRIG, CHOLHDL, LDLDIRECT in the last 72 hours. Thyroid function studies  Recent Labs  07/04/16 0107  TSH 3.950   Anemia work up No results for input(s): VITAMINB12, FOLATE, FERRITIN, TIBC, IRON, RETICCTPCT in the last 72 hours. Urinalysis    Component Value Date/Time   COLORURINE YELLOW 06/16/2016 [place] 06/16/2016 0548   LABSPEC 1.009 06/16/2016 0548   PHURINE 6.5 06/16/2016 0548   GLUCOSEU NEGATIVE 06/16/2016 0548   HGBUR LARGE (A) 06/16/2016 0548   BILIRUBINUR NEGATIVE 06/16/2016 0548   KETONESUR NEGATIVE 06/16/2016 0548   PROTEINUR NEGATIVE 06/16/2016 0548   NITRITE NEGATIVE 06/16/2016 0548   LEUKOCYTESUR NEGATIVE  06/16/2016 0548   Sepsis Labs Invalid input(s): PROCALCITONIN,  WBC,  LACTICIDVEN Microbiology Recent Results (from the past 240 hour(s))  Body fluid culture     Status: None (Preliminary result)   Collection Time: 07/03/16 10:10 PM  Result Value Ref Range Status   Specimen Description FLUID  Final   Special Requests PLEURAL  Final   Gram Stain   Final    RARE WBC PRESENT, PREDOMINANTLY PMN NO ORGANISMS SEEN    Culture PENDING  Incomplete   Report Status PENDING  Incomplete    Time coordinating discharge: Over 30 minutes  Vance Gather, MD  [facility] 07/04/2016, 10:28 AM Pager  [phone]  If 7PM-7AM, please contact night-coverage [email or web address] Password [email or web address]

## 2016-07-04 NOTE — ED Notes (Signed)
Eddie Dibbles, NP Critical Care/Pulmonology at bedside

## 2016-07-04 NOTE — Progress Notes (Signed)
Initial Nutrition Assessment  DOCUMENTATION CODES:   Severe malnutrition in context of chronic illness, Underweight  INTERVENTION:    Ensure Enlive po TID, each supplement provides 350 kcal and 20 grams of protein  NUTRITION DIAGNOSIS:   Malnutrition related to chronic illness as evidenced by severe depletion of body fat, severe depletion of muscle mass  GOAL:   Patient will meet greater than or equal to 90% of their needs  MONITOR:   PO intake, Supplement acceptance, Labs, Weight trends, Skin, I & O's  REASON FOR ASSESSMENT:   Consult Assessment of nutrition requirement/status  ASSESSMENT:   60 y.o. Male with PMH significant of COPD, O2 focal pneumonia and hyponatremia, Schizophrenia, HTN leukocytosis CHF with diastolic dysfunction, underlining lung disease; admitted with shortness of breath.  Patient reports a good appetite. He endorses an 8 lb weight loss in the last 10 days. States he was not receiving any oral nutrition supplements at facility [facility] because "they didn't have them". He is agreeable to chocolate Ensure Enlive during hospitalization >> will order. Nutrition-Focused physical exam findings are severe fat depletion, severe muscle depletion, and no edema.   Diet Order:  Diet Heart Room service appropriate? Yes; Fluid consistency: Thin  Skin:  Wound (see comment) (Stage I to buttock)  Last BM:  PTA  Height:   Ht Readings from Last 1 Encounters:  07/04/16 6\' 7"  (2.007 m)    Weight:   Wt Readings from Last 1 Encounters:  07/04/16 154 lb 12.8 oz (70.2 kg)    Ideal Body Weight:  100 kg  BMI:  Body mass index is 17.44 kg/m.  Estimated Nutritional Needs:   Kcal:  2100-2300  Protein:  110-120 gm  Fluid:  2.1-2.3 L  EDUCATION NEEDS:   No education needs identified at this time  Arthur Holms, RD, LDN Pager #: [phone] After-Hours Pager #: [phone]

## 2016-07-04 NOTE — ED Notes (Signed)
EDP and resident at bedside reinforcing chest tube to LEFT chest

## 2016-07-04 NOTE — Care Management Note (Signed)
Case Management Note  Patient Details  Name: Jeffrey Ochoa MRN: [id number] Date of Birth: 1955/08/08  Subjective/Objective:    Pt transferred from [facility] and will transfer back there.  [facility] will provide transport.                        Expected Discharge Plan:  [place] (LT[facility])  Discharge planning Services  CM Consult  Status of Service:  Completed, signed off  Girard Cooter, [place] 07/04/2016, 2:47 PM

## 2016-07-04 NOTE — Progress Notes (Signed)
Name: Jeffrey Ochoa MRN: [id number] DOB: 1956/04/17    ADMISSION DATE:  07/03/2016 CONSULTATION DATE:  11/29  REFERRING MD :  Dr. Roel Cluck  CHIEF COMPLAINT:  Dyspnea  BRIEF PATIENT DESCRIPTION: 60 year old male from [facility] recently admitted with what was thought to be acute pneumonitis/ILD. He was treated with steroids and discharged to SNF. Now presenting 11/29 with L PTX.  SIGNIFICANT EVENTS  11/20 [facility]  11/29 admit to [facility] with [facility]X  STUDIES:  HRCT CHEST W/O 11/18:  Personally reviewed by me. No obvious cranial to caudal prevalence of patchy bilateral groundglass opacities with some associated bronchiectasis suggesting traction bronchiectasis. Patchy intralobular septal thickening bilaterally consistent with fibrosis. Certainly findings are more prevalent in the lower lung zones. 7 mm right lower lobe noduledue to his and posterior segment. No definitive honeycombing. Questionable tiny left pleural effusion versus pleural thickening. Radiology commented on right pleural effusion but I did not appreciate this. Subcarinal lymph node measuring 1.4 cm in short axis but no other pathologically enlarged mediastinal adenopathy. No pericardial effusion. TTE 11/18: Moderate LVH with grade 1 diastolic dysfunction. Normal regional wall motion. EF 60-65%. LA & RA normal in size. RV normal in size and function. No aortic stenosis or regurgitation. Aortic root normal in size. No mitral stenosis or regurgitation. No significant pulmonic regurgitation. No significant tricuspid regurgitation. No pericardial effusion.    HISTORY OF PRESENT ILLNESS:  60 year old male with past medical history as below, which is significant for schizophrenia, tobacco abuse, and hypertension. He was recently admitted to [facility]  The beginning of November 2017 with complaints of shortness of breath. He had diffuse pulmonary infiltrates on chest x-ray and underwent high-resolution CT which  demonstrated groundglass opacifications with some bronchiectasis. This was thought to be secondary to acute pneumonitis/ILD. Autoimmune workup has essentially been negative since that time. He was started on steroids and discharged to [facility] (where he lives) on 60 mg of Solu-Medrol twice a day. Since that time he indicates that his breathing is not really improved much. 07/03/2016 he began to experience increasing dyspnea and worsening oxygen demands. He is on a Ventimask at baseline, however, he was requiring nonrebreather. [facility] staff called EMS and had him brought to the emergency department where he was noted to have left-sided pneumothorax on x-ray. Chest tube was placed in the emergency department and PCCM asked to see for further evaluation. He denies fever, chills, cough in the days preceding this incident.    SUBJECTIVE: denies pain FM off Chest tube - air leak + on coughing  VITAL SIGNS: Temp:  [98.4 F (36.9 C)] 98.4 F (36.9 C) (11/29 1623) Pulse Rate:  [63-103] 65 (11/30 0715) Resp:  [17-30] 26 (11/30 0545) BP: (98-138)/(56-85) 98/66 (11/30 0715) SpO2:  [92 %-100 %] 97 % (11/30 0715) Weight:  [175 lb (79.4 kg)] 175 lb (79.4 kg) (11/29 1624)  PHYSICAL EXAMINATION: General:  Male who appears older than stated age, very thin Neuro:  Alert, oriented, nonfocal HEENT:  Normocephalic, atraumatic, PERRL, no JVD Cardiovascular:  RRR, no MRG, no peripheral edema Lungs:  Scattered rhonchi Abdomen:  Soft, nontender, nondistended Musculoskeletal:  No acute deformity or ROM limitation Skin:  Grossly intact    Recent Labs Lab 07/03/16 1714 07/04/16 0106  NA 136 136  K 4.2 3.9  CL 100* 100*  CO2 30 30  BUN 15 15  CREATININE 0.75 0.73  GLUCOSE 118* 95    Recent Labs Lab 07/03/16 1714 07/04/16 0106  HGB 13.3  13.6  HCT 40.0 42.8  WBC 16.6* 16.7*  PLT 294 273   Dg Chest Port 1 View  Result Date: 07/03/2016 CLINICAL DATA:  Follow-up left-sided chest tube placement.  Subsequent encounter. EXAM: PORTABLE CHEST 1 VIEW COMPARISON:  Chest radiograph performed earlier today at 7:12 p.m., and CT of the chest performed 06/22/2016 FINDINGS: The patient's small left apical pneumothorax has increased mildly in size from the prior study. A left-sided chest tube is unchanged in appearance. Patchy bilateral airspace opacity may reflect atypical or viral pneumonia, as noted on prior CT, similar in appearance to the prior study. No pleural effusion is seen. The cardiomediastinal silhouette remains normal in size. No acute osseous abnormalities are identified. IMPRESSION: 1. Small left apical pneumothorax has increased mildly in size since the prior study. Stable appearance to left-sided chest tube. 2. Persistent bilateral patchy airspace opacity may reflect atypical or viral pneumonia, as noted on prior CT. These results were called by telephone at the time of interpretation on 07/03/2016 at 10:44 pm to Robards RN in the [facility] ER, who verbally acknowledged these results. Electronically Signed   By: Garald Balding M.D.   On: 07/03/2016 22:44   Dg Chest Portable 1 View  Result Date: 07/03/2016 CLINICAL DATA:  Chest tube placement. EXAM: PORTABLE CHEST 1 VIEW COMPARISON:  07/03/2016 at 1629 hours FINDINGS: The cardiomediastinal silhouette is unchanged and within normal limits. A left-sided chest tube has been placed and terminates over the posterior sixth rib interspace. There is only a small residual left apical pneumothorax, greatly decreased from prior. Severe chronic chronic interstitial lung disease is again noted. No sizable pleural effusion is seen. IMPRESSION: Interval left chest tube placement with small residual pneumothorax. Electronically Signed   By: Logan Bores M.D.   On: 07/03/2016 19:28   Dg Chest Portable 1 View  Result Date: 07/03/2016 CLINICAL DATA:  Chest pain and shortness of breath. EXAM: PORTABLE CHEST 1 VIEW COMPARISON:  06/22/2016 FINDINGS: Moderate  left-sided pneumothorax present of approximately 30-40% volume. Underlying severe chronic fibrotic lung disease with potential multifocal pneumonia again noted present. No significant component of pleural fluid. No significant shift of the mediastinal structures. The heart size is normal. IMPRESSION: Proximally 30-40% left pneumothorax. These results were called by telephone at the time of interpretation on 07/03/2016 at 4:43 pm to Dr. Marda Stalker , who verbally acknowledged these results. Electronically Signed   By: Aletta Edouard M.D.   On: 07/03/2016 16:47    ASSESSMENT / PLAN:  Left sided spontaneous pneumothorax - chest tube placed in ED, .Apical persists- Chest tube to 20cm H2O suction. - Follow daily CXR  Chronic hypoxemic respiratory failure - etiology remains unclear. Thought to be acute pneumonitis/ILD. He is DNR so he is high risk for biopsy. Autoimmune workup to date has included C3, C4, histone antibodies, anti-DNA antibody, Anti-Smith, CCP, RF, ANCA, GBM, and ANA and has largely been negative with the exception of mildly elevated ESR, which is non-specific. He does not feel as though steroids have helped him. Doubt infectious etiology, leukocytosis likely due to chronic steroids. - Supplemental O2 to keep O2 sats > 90% - Weaning from NRB to venti mask - ct decadron 4 q 12h   RLL nodule, Subcarinal mediastinal LAN - noted on CT last admit - Repeat CT scan in 3 months.   Rigoberto Noel MD  07/04/2016 8:40 AM

## 2016-07-04 NOTE — Progress Notes (Signed)
Report given to Minna Merritts, RN  On 4W at [facility]. Patient transferred via stretcher by [facility]AR. Patient IV removed, CCMD notified of discharge, and Chest Tube placed to water seal for transport. RN given direction that patient is to be at 20 of suction when he arrives to them.   Milford Cage, RN

## 2016-07-05 LAB — PH, BODY FLUID: PH, BODY FLUID: 7.8

## 2016-07-07 LAB — BODY FLUID CULTURE: CULTURE: NO GROWTH

## 2016-07-09 LAB — ADENOSIDE DEAMINASE, PLEURAL FL: ADENOSIDE DEAMINASE, PLEURAL FL: 13.8 U/L — ABNORMAL HIGH (ref 0.0–9.4)

## 2016-08-12 ENCOUNTER — Telehealth: Payer: Self-pay | Admitting: Family Medicine

## 2016-08-12 NOTE — Telephone Encounter (Signed)
Sister declined medicare wellness appointment due to patient being in assisted living.

## 2016-10-01 ENCOUNTER — Telehealth: Payer: Self-pay | Admitting: Family Medicine

## 2016-10-01 NOTE — Telephone Encounter (Signed)
lvm advising patient to schedule AWV °

## 2018-04-17 IMAGING — DX DG CHEST 1V PORT
1 series · 2 of 2 positions shown · non-contrast
Comparison: 06/16/2016

CLINICAL DATA: COPD with exacerbation

EXAM:
PORTABLE CHEST 1 VIEW

[Series 1: chest ap · 0.14mm/px · 2 of 2 slices shown]
[im 1/2]
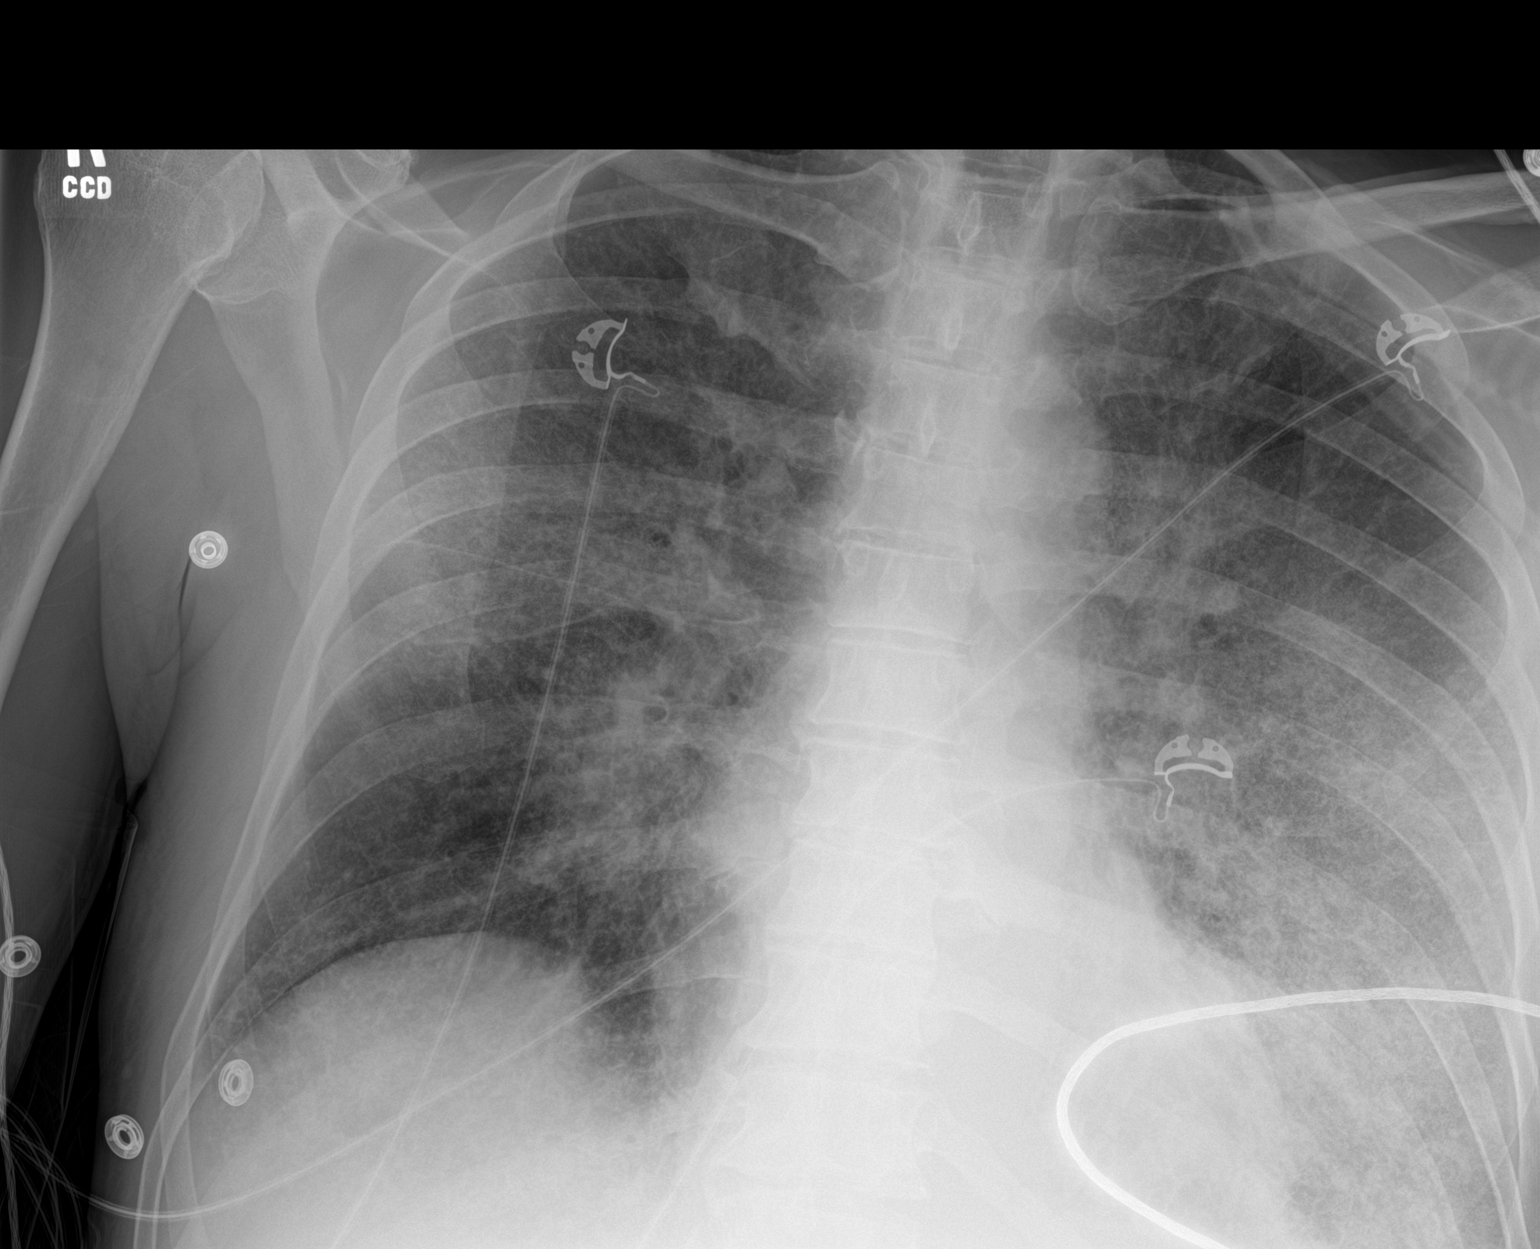
[im 2/2]
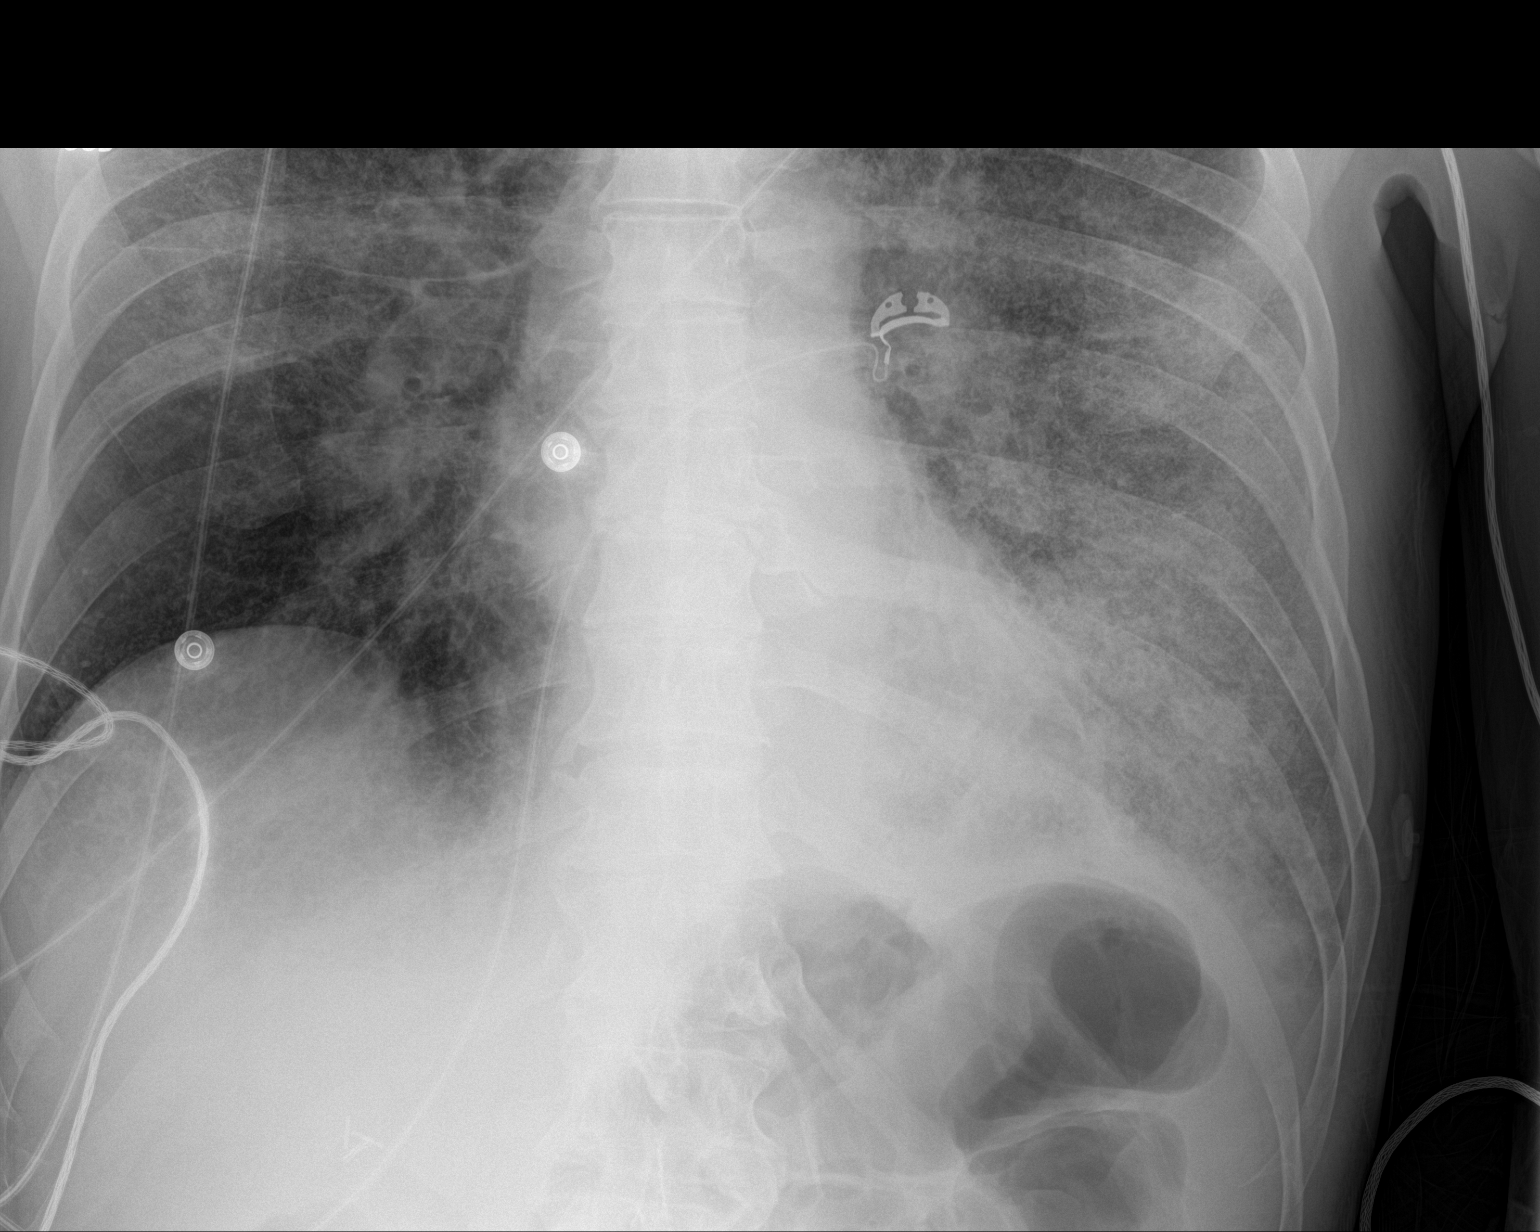

[2 of 2 positions shown; findings below may reference images not displayed]

FINDINGS: Normal cardiac silhouette. There is fine airspace disease in the
RIGHT upper lobe and LEFT lower lobe similar to comparison exam. No
pneumothorax no pleural fluid evident.
IMPRESSION: Bilateral airspace disease suggesting pulmonary edema versus
multifocal pneumonia. No significant change.

## 2018-04-18 IMAGING — DX DG CHEST 1V PORT
1 series · 2 of 2 positions shown · non-contrast
Comparison: 06/18/2016

CLINICAL DATA: Pneumonia.

EXAM:
PORTABLE CHEST 1 VIEW

[Series 1: chest ap · 0.14mm/px · 2 of 2 slices shown]
[im 1/2]
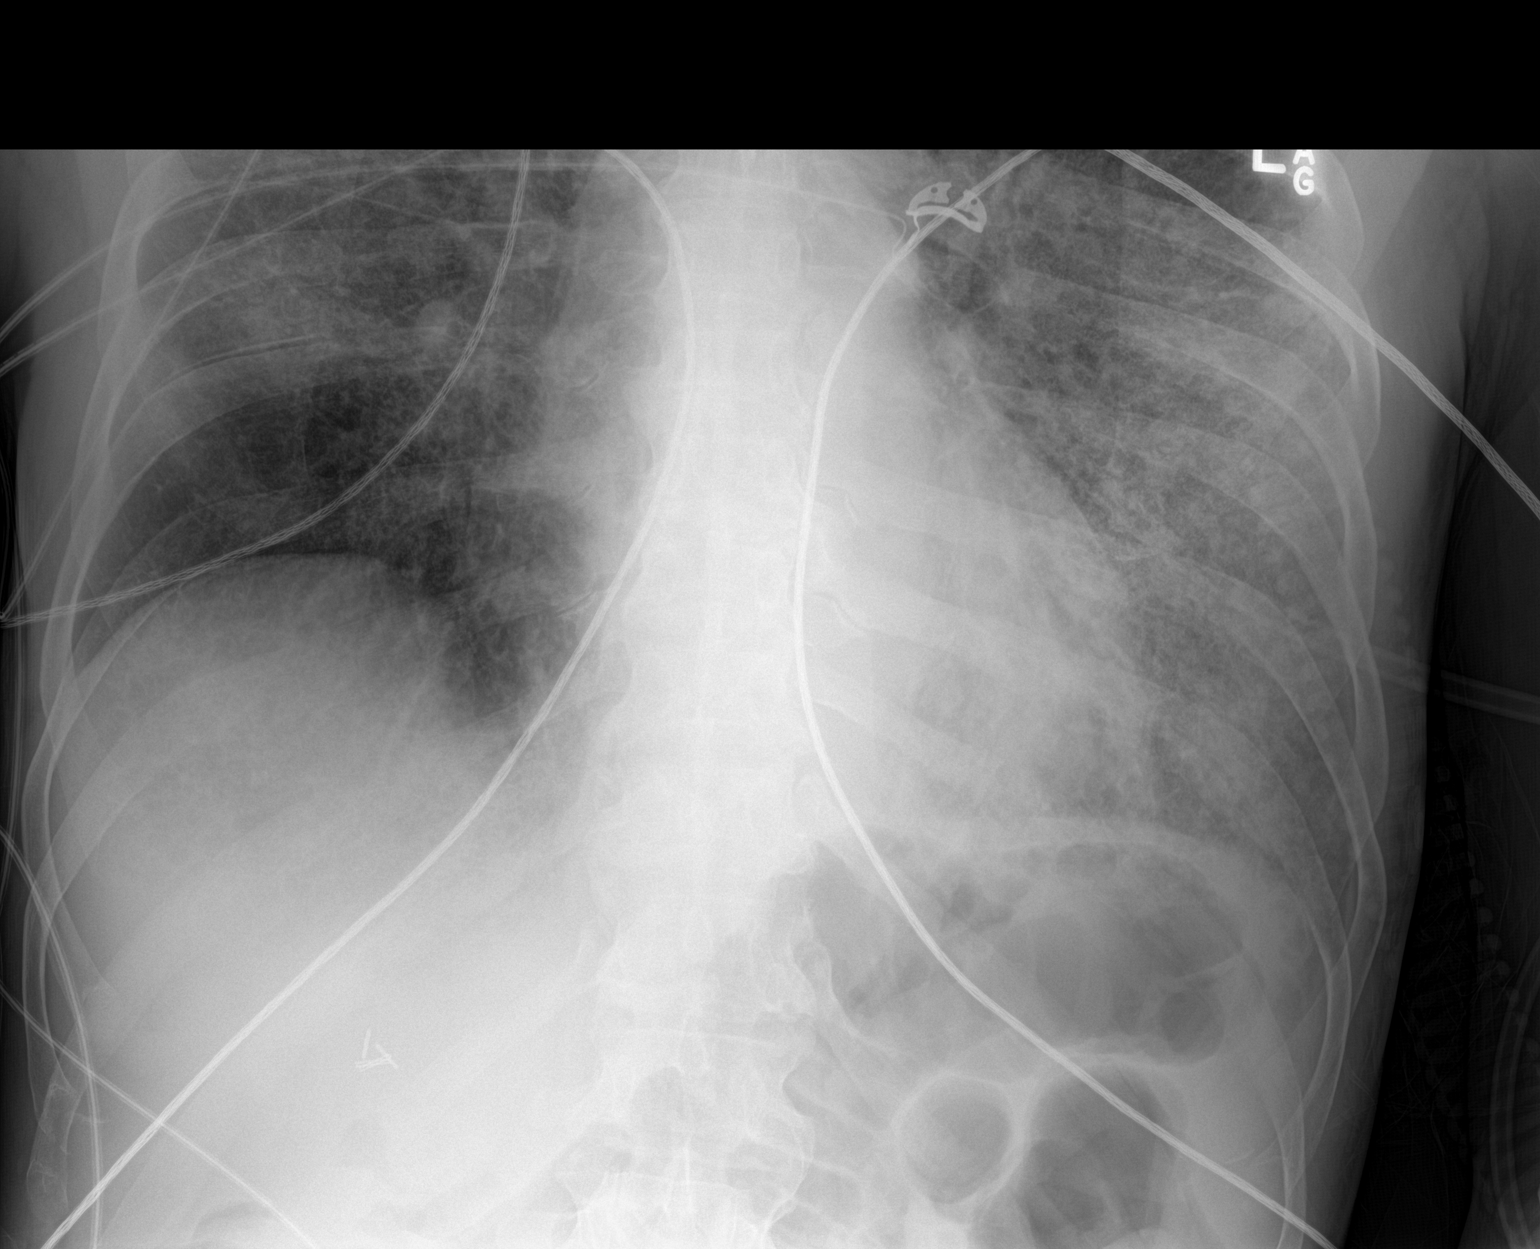
[im 2/2]
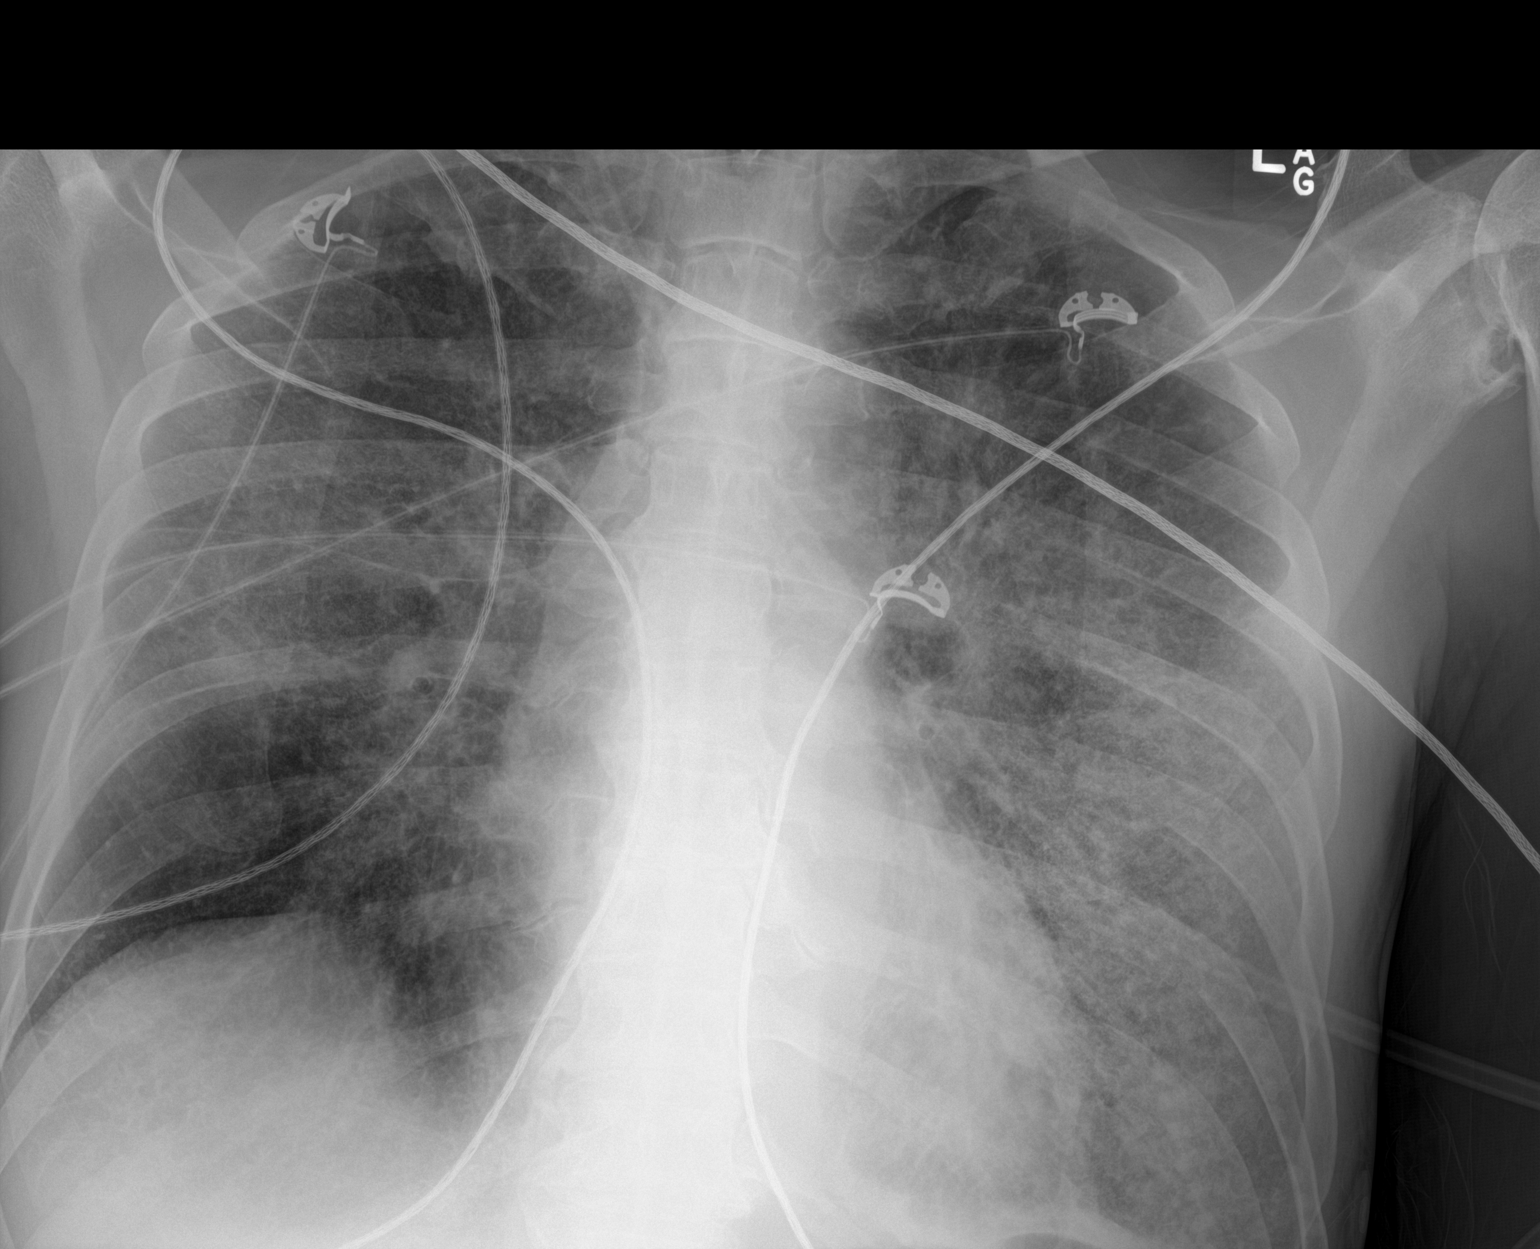

[2 of 2 positions shown; findings below may reference images not displayed]

FINDINGS: The cardiomediastinal silhouette is within normal limits. Bilateral
lung opacities, greatest in the left lower lobe, have not
significantly changed from the prior study. No sizable pleural
effusion or pneumothorax is identified. No acute osseous abnormality
is seen.
IMPRESSION: Unchanged bilateral lung opacities concerning for multifocal
pneumonia.

## 2018-05-02 IMAGING — DX DG CHEST 1V PORT
1 series · 1 of 1 positions shown · non-contrast
Comparison: 06/22/2016

CLINICAL DATA: Chest pain and shortness of breath.

EXAM:
PORTABLE CHEST 1 VIEW

[chest ap]
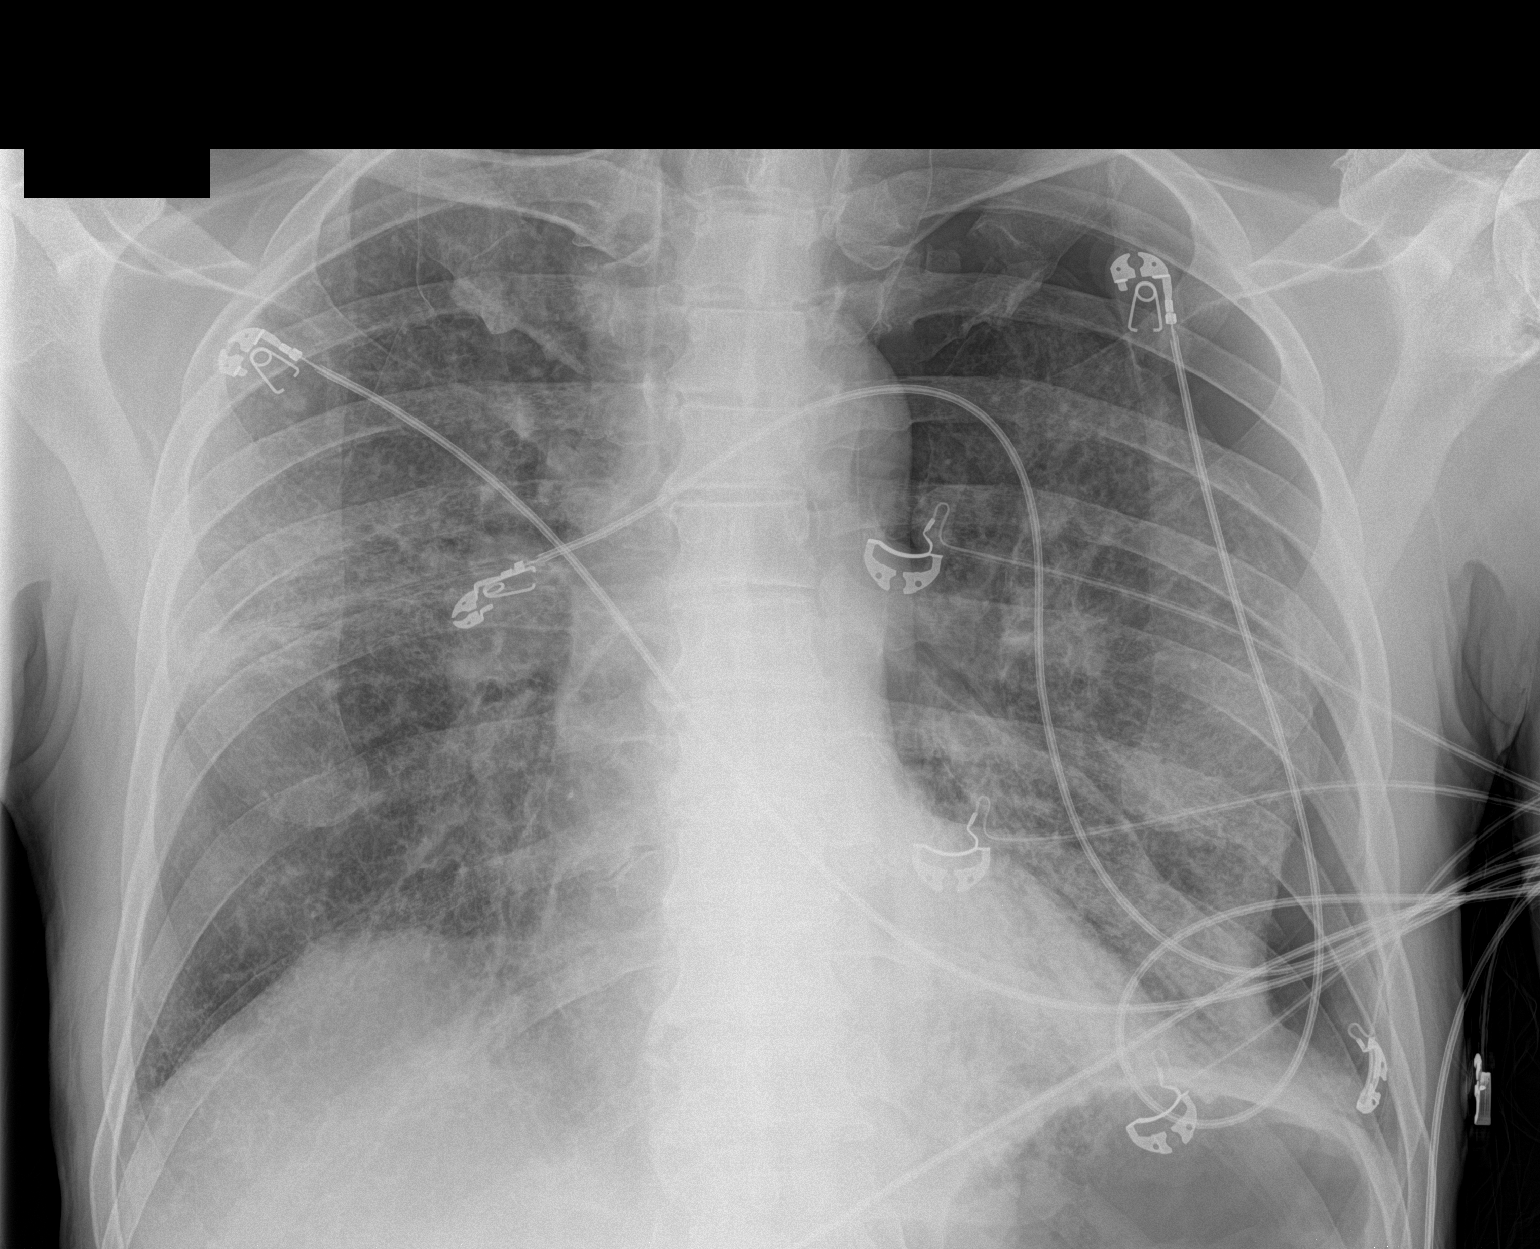

[1 of 1 positions shown; findings below may reference images not displayed]

FINDINGS: Moderate left-sided pneumothorax present of approximately 30-40%
volume. Underlying severe chronic fibrotic lung disease with
potential multifocal pneumonia again noted present. No significant
component of pleural fluid. No significant shift of the mediastinal
structures. The heart size is normal.
IMPRESSION: Proximally 30-40% left pneumothorax.

These results were called by telephone at the time of interpretation
on 07/03/2016 at [DATE] to Dr. JHAC VZ , who verbally
acknowledged these results.
